# Patient Record
Sex: Female | Born: 1950 | Race: Black or African American | Hispanic: No | Smoking: Former smoker
Health system: Southern US, Community
[De-identification: ages and names within clinical notes are randomized; demographics above are authoritative.]

## PROBLEM LIST (undated history)

## (undated) DIAGNOSIS — H3320 Serous retinal detachment, unspecified eye: Secondary | ICD-10-CM

## (undated) DIAGNOSIS — E669 Obesity, unspecified: Secondary | ICD-10-CM

## (undated) DIAGNOSIS — I10 Essential (primary) hypertension: Secondary | ICD-10-CM

## (undated) HISTORY — PX: OTHER SURGICAL HISTORY: SHX169

## (undated) HISTORY — PX: EYE SURGERY: SHX253

## (undated) HISTORY — PX: CATARACT EXTRACTION: SUR2

## (undated) HISTORY — PX: JOINT REPLACEMENT: SHX530

---

## 2005-07-29 ENCOUNTER — Other Ambulatory Visit: Payer: Self-pay

## 2005-08-04 ENCOUNTER — Ambulatory Visit: Payer: Self-pay | Admitting: Unknown Physician Specialty

## 2006-10-11 ENCOUNTER — Ambulatory Visit: Payer: Self-pay | Admitting: General Practice

## 2006-10-24 ENCOUNTER — Ambulatory Visit: Payer: Self-pay

## 2007-07-03 ENCOUNTER — Ambulatory Visit: Payer: Self-pay | Admitting: Emergency Medicine

## 2007-09-04 ENCOUNTER — Ambulatory Visit: Payer: Self-pay | Admitting: Unknown Physician Specialty

## 2007-09-04 ENCOUNTER — Other Ambulatory Visit: Payer: Self-pay

## 2007-09-13 ENCOUNTER — Inpatient Hospital Stay: Payer: Self-pay | Admitting: Unknown Physician Specialty

## 2008-07-16 ENCOUNTER — Ambulatory Visit (HOSPITAL_COMMUNITY): Admission: RE | Admit: 2008-07-16 | Discharge: 2008-07-17 | Payer: Self-pay | Admitting: Ophthalmology

## 2011-03-30 NOTE — Op Note (Signed)
NAME:  Marissa Craig, Marissa Craig NO.:  [id number]   MEDICAL RECORD NO.:  [id number]          PATIENT TYPE:  OIB   LOCATION:  5121                         FACILITY:  [facility]   PHYSICIAN:  Beulah Gandy. Ashley Royalty, M.D. DATE OF BIRTH:  [birth year]   DATE OF PROCEDURE:  07/16/2008  DATE OF DISCHARGE:                               OPERATIVE REPORT   ADMISSION DIAGNOSES:  Macular hole and retinal break, right eye.   PROCEDURES:  Pars plana vitrectomy with membrane peel, retinal  photocoagulation, serum patch, and gas-fluid exchange in the right eye.   SURGEON:  Beulah Gandy. Ashley Royalty, MD   ASSISTANT:  Rosalie Doctor, MA   ANESTHESIA:  General.   DETAILS:  Usual prep and drape, conjunctival peritomies at 8, 10, and 2  o'clock.  Sclerotomies at 8, 10, and 2 o'clock.  Infusion at 8 o'clock.  The contact lens ring anchored into place at 6 and 12 o'clock.  Provisc  placed on the corneal surface and the flat contact lens was placed.  Pars plana vitrectomy was begun just behind the crystalline lens.  Vitreous and pigment were encountered.  This was carefully removed under  low suction and rapid cutting.  Attention was carried to the periphery  where the 30-degree prismatic lens was used to perform vitrectomy down  to the vitreous base for 360 degrees.  A large horseshoe tear and walled  off detachment were seen in the lower temporal quadrant.  This area was  cleansed of vitreous and vitreous traction.  A bridging vessel was seen  across the horseshoe tear flap.  The vitrectomy was continued until all  vitreous was removed out to the vitreous base.  The indirect  ophthalmoscope laser was moved into place, 280 burns were placed around  the retinal break and around the retinal periphery.  The power was 500  milliwatts, 1000 microns each, and 1.1 seconds each.  The magnifying  contact lens was then placed and the macular hole was studied.  The hole  was oval and taut because of an epiretinal membrane.   A 20-gauge MVR was  used to engage this membrane and peel it toward the macular hole and  then out from the macular hole out to the arcades and beyond the lighted  pick.  The Eagle pick and the MVR blade were used for this.  Once all of  the membrane was removed, the cutter was repositioned in the eye and all  remnants of the membrane were removed.  A total gas-fluid exchange was  carried.  The [place] Ophthalmic brush was used to make the retinal  surface dry.  The serum patch was prepared and perfluoropropane 16% was  prepared.  Additional [place] Ophthalmic brush vacuuming of blood was  performed.  The serum patch was delivered.  The C3F8 was exchanged for  intravitreal gas.  The instruments were removed from the eye, and 9-0  nylon was used to close the sclerotomy sites.  The conjunctiva was  closed with wet-field cautery.  Contact lens ring was removed.  Polymyxin and gentamicin were irrigated into [place] space.  Atropine  solution was applied.  Marcaine was injected around the  globe for postop pain.  Decadron 10 mg was injected to the lower  subconjunctival space.  Closing pressure was 15 with a  [place]  tonometer.  Complications none.  Duration 1 hour.  The patient was  awakened and taken to recovery in satisfactory condition.      Beulah Gandy. Ashley Royalty, M.D.  Electronically Signed     JDM/MEDQ  D:  07/16/2008  T:  07/17/2008  Job:  [id number]

## 2011-08-18 LAB — BASIC METABOLIC PANEL
BUN: 11
CO2: 27
Calcium: 9.5
Chloride: 106
Creatinine, Ser: 0.66
GFR calc Af Amer: >60
GFR calc non Af Amer: >60
Glucose, Bld: 106 — ABNORMAL HIGH
Potassium: 3.9
Sodium: 138

## 2011-08-18 LAB — AUTOLOGOUS SERUM PATCH PREP

## 2011-08-18 LAB — CBC
HCT: 39.2
Hemoglobin: 13.4
MCHC: 34.2
MCV: 95
Platelets: 240
RBC: 4.12
RDW: 13.1
WBC: 3.9 — ABNORMAL LOW

## 2013-06-05 ENCOUNTER — Ambulatory Visit: Payer: Self-pay | Admitting: Family Medicine

## 2014-01-28 ENCOUNTER — Ambulatory Visit: Payer: Self-pay | Admitting: Physician Assistant

## 2015-05-28 ENCOUNTER — Other Ambulatory Visit: Payer: Self-pay | Admitting: Physician Assistant

## 2015-05-28 DIAGNOSIS — Z1231 Encounter for screening mammogram for malignant neoplasm of breast: Secondary | ICD-10-CM

## 2015-06-04 ENCOUNTER — Ambulatory Visit
Admission: RE | Admit: 2015-06-04 | Discharge: 2015-06-04 | Disposition: A | Discharge status: Home / Self Care | Payer: BLUE CROSS/BLUE SHIELD | Source: Ambulatory Visit | Attending: Physician Assistant | Admitting: Physician Assistant

## 2015-06-04 DIAGNOSIS — Z1231 Encounter for screening mammogram for malignant neoplasm of breast: Secondary | ICD-10-CM | POA: Diagnosis not present

## 2015-12-19 ENCOUNTER — Encounter (INDEPENDENT_AMBULATORY_CARE_PROVIDER_SITE_OTHER): Payer: BLUE CROSS/BLUE SHIELD | Admitting: Ophthalmology

## 2015-12-19 DIAGNOSIS — I10 Essential (primary) hypertension: Secondary | ICD-10-CM

## 2015-12-19 DIAGNOSIS — H43813 Vitreous degeneration, bilateral: Secondary | ICD-10-CM

## 2015-12-19 DIAGNOSIS — H35341 Macular cyst, hole, or pseudohole, right eye: Secondary | ICD-10-CM

## 2015-12-19 DIAGNOSIS — H35371 Puckering of macula, right eye: Secondary | ICD-10-CM

## 2015-12-19 DIAGNOSIS — H2512 Age-related nuclear cataract, left eye: Secondary | ICD-10-CM

## 2015-12-19 DIAGNOSIS — H35033 Hypertensive retinopathy, bilateral: Secondary | ICD-10-CM | POA: Diagnosis not present

## 2016-03-16 ENCOUNTER — Encounter (INDEPENDENT_AMBULATORY_CARE_PROVIDER_SITE_OTHER): Payer: BLUE CROSS/BLUE SHIELD | Admitting: Ophthalmology

## 2016-03-16 DIAGNOSIS — H33301 Unspecified retinal break, right eye: Secondary | ICD-10-CM

## 2016-03-16 DIAGNOSIS — H35033 Hypertensive retinopathy, bilateral: Secondary | ICD-10-CM

## 2016-03-16 DIAGNOSIS — H35371 Puckering of macula, right eye: Secondary | ICD-10-CM

## 2016-03-16 DIAGNOSIS — H43812 Vitreous degeneration, left eye: Secondary | ICD-10-CM | POA: Diagnosis not present

## 2016-03-16 DIAGNOSIS — I10 Essential (primary) hypertension: Secondary | ICD-10-CM | POA: Diagnosis not present

## 2016-06-02 ENCOUNTER — Other Ambulatory Visit: Payer: Self-pay | Admitting: Physician Assistant

## 2016-06-02 DIAGNOSIS — Z1231 Encounter for screening mammogram for malignant neoplasm of breast: Secondary | ICD-10-CM

## 2016-06-16 ENCOUNTER — Ambulatory Visit
Admission: RE | Admit: 2016-06-16 | Discharge: 2016-06-16 | Disposition: A | Discharge status: Home / Self Care | Payer: BLUE CROSS/BLUE SHIELD | Source: Ambulatory Visit | Attending: Physician Assistant | Admitting: Physician Assistant

## 2016-06-16 ENCOUNTER — Other Ambulatory Visit: Payer: Self-pay | Admitting: Physician Assistant

## 2016-06-16 DIAGNOSIS — Z1231 Encounter for screening mammogram for malignant neoplasm of breast: Secondary | ICD-10-CM | POA: Diagnosis present

## 2016-09-16 ENCOUNTER — Ambulatory Visit (INDEPENDENT_AMBULATORY_CARE_PROVIDER_SITE_OTHER): Payer: BLUE CROSS/BLUE SHIELD | Admitting: Ophthalmology

## 2016-09-16 DIAGNOSIS — I10 Essential (primary) hypertension: Secondary | ICD-10-CM | POA: Diagnosis not present

## 2016-09-16 DIAGNOSIS — H35341 Macular cyst, hole, or pseudohole, right eye: Secondary | ICD-10-CM | POA: Diagnosis not present

## 2016-09-16 DIAGNOSIS — H43812 Vitreous degeneration, left eye: Secondary | ICD-10-CM | POA: Diagnosis not present

## 2016-09-16 DIAGNOSIS — H35033 Hypertensive retinopathy, bilateral: Secondary | ICD-10-CM

## 2016-09-16 DIAGNOSIS — H35371 Puckering of macula, right eye: Secondary | ICD-10-CM

## 2016-09-16 DIAGNOSIS — H2512 Age-related nuclear cataract, left eye: Secondary | ICD-10-CM

## 2016-10-01 ENCOUNTER — Encounter: Payer: Self-pay | Admitting: *Deleted

## 2016-10-04 ENCOUNTER — Encounter
Admission: RE | Disposition: A | Discharge status: Home / Self Care | Payer: Self-pay | Source: Ambulatory Visit | Attending: Unknown Physician Specialty

## 2016-10-04 ENCOUNTER — Encounter: Payer: Self-pay | Admitting: *Deleted

## 2016-10-04 ENCOUNTER — Ambulatory Visit
Admission: RE | Admit: 2016-10-04 | Discharge: 2016-10-04 | Disposition: A | Discharge status: Home / Self Care | Payer: BLUE CROSS/BLUE SHIELD | Source: Ambulatory Visit | Attending: Unknown Physician Specialty | Admitting: Unknown Physician Specialty

## 2016-10-04 ENCOUNTER — Ambulatory Visit: Payer: BLUE CROSS/BLUE SHIELD | Admitting: Anesthesiology

## 2016-10-04 DIAGNOSIS — Z1211 Encounter for screening for malignant neoplasm of colon: Secondary | ICD-10-CM | POA: Insufficient documentation

## 2016-10-04 DIAGNOSIS — Z86010 Personal history of colonic polyps: Secondary | ICD-10-CM | POA: Insufficient documentation

## 2016-10-04 DIAGNOSIS — Z87891 Personal history of nicotine dependence: Secondary | ICD-10-CM | POA: Insufficient documentation

## 2016-10-04 DIAGNOSIS — D123 Benign neoplasm of transverse colon: Secondary | ICD-10-CM | POA: Diagnosis not present

## 2016-10-04 DIAGNOSIS — K648 Other hemorrhoids: Secondary | ICD-10-CM | POA: Insufficient documentation

## 2016-10-04 DIAGNOSIS — K5730 Diverticulosis of large intestine without perforation or abscess without bleeding: Secondary | ICD-10-CM | POA: Diagnosis not present

## 2016-10-04 DIAGNOSIS — Z7982 Long term (current) use of aspirin: Secondary | ICD-10-CM | POA: Diagnosis not present

## 2016-10-04 DIAGNOSIS — I10 Essential (primary) hypertension: Secondary | ICD-10-CM | POA: Diagnosis not present

## 2016-10-04 DIAGNOSIS — Z6841 Body Mass Index (BMI) 40.0 and over, adult: Secondary | ICD-10-CM | POA: Insufficient documentation

## 2016-10-04 HISTORY — DX: Obesity, unspecified: E66.9

## 2016-10-04 HISTORY — DX: Serous retinal detachment, unspecified eye: H33.20

## 2016-10-04 HISTORY — PX: COLONOSCOPY WITH PROPOFOL: SHX5780

## 2016-10-04 HISTORY — DX: Essential (primary) hypertension: I10

## 2016-10-04 SURGERY — COLONOSCOPY WITH PROPOFOL
Anesthesia: General

## 2016-10-04 MED ORDER — FENTANYL CITRATE (PF) 100 MCG/2ML IJ SOLN
INTRAMUSCULAR | Status: DC | PRN
  Administered 2016-10-04: 50 ug via INTRAVENOUS

## 2016-10-04 MED ORDER — PROPOFOL 10 MG/ML IV BOLUS
INTRAVENOUS | Status: DC | PRN
  Administered 2016-10-04: 50 mg via INTRAVENOUS

## 2016-10-04 MED ORDER — PIPERACILLIN-TAZOBACTAM 3.375 G IVPB 30 MIN
3.3750 g | Freq: Once | INTRAVENOUS | Status: AC
  Administered 2016-10-04: 3.375 g via INTRAVENOUS
  Filled 2016-10-04: qty 50

## 2016-10-04 MED ORDER — SODIUM CHLORIDE 0.9 % IV SOLN: INTRAVENOUS | Status: DC

## 2016-10-04 MED ORDER — PHENYLEPHRINE HCL 10 MG/ML IJ SOLN
INTRAMUSCULAR | Status: DC | PRN
  Administered 2016-10-04 (×2): 100 ug via INTRAVENOUS

## 2016-10-04 MED ORDER — SODIUM CHLORIDE 0.9 % IV SOLN
INTRAVENOUS | Status: DC
  Administered 2016-10-04: 01:00:00 via INTRAVENOUS
  Administered 2016-10-04: 1000 mL via INTRAVENOUS

## 2016-10-04 MED ORDER — PROPOFOL 500 MG/50ML IV EMUL
INTRAVENOUS | Status: DC | PRN
Start: 2016-10-04 — End: 2016-10-04
  Administered 2016-10-04: 100 ug/kg/min via INTRAVENOUS

## 2016-10-04 NOTE — Op Note (Signed)
[facility] Gastroenterology Patient Name: Marissa Craig Procedure Date: 10/04/2016 10:13 AM MRN: [id number] Account #: [id number] Date of Birth: 1951/07/27 Admit Type: Outpatient Age: 65 Room: [facility] ENDO ROOM 1 Gender: Female Note Status: Finalized Procedure:            Colonoscopy Indications:          High risk colon cancer surveillance: Personal history                        of colonic polyps Providers:            Manya Silvas, MD Referring MD:         Precious Bard, MD (Referring MD) Medicines:            Propofol per Anesthesia Complications:        No immediate complications. Procedure:            Pre-Anesthesia Assessment:                       - After reviewing the risks and benefits, the patient                        was deemed in satisfactory condition to undergo the                        procedure.                       After obtaining informed consent, the colonoscope was                        passed under direct vision. Throughout the procedure,                        the patient's blood pressure, pulse, and oxygen                        saturations were monitored continuously. The                        Colonoscope was introduced through the anus and                        advanced to the the cecum, identified by appendiceal                        orifice and ileocecal valve. The colonoscopy was                        performed without difficulty. The patient tolerated the                        procedure well. The quality of the bowel preparation                        was excellent. Findings:      A small polyp was found in the transverse colon. The polyp was sessile.       The polyp was removed with a cold snare. Resection and retrieval were       complete.      A small polyp was found  in the transverse colon. The polyp was sessile.       The polyp was removed with a hot snare. Resection and retrieval were       complete. To  prevent bleeding after the polypectomy, one hemostatic clip       was successfully placed. There was no bleeding during, or at the end, of       the procedure.      Multiple small-mouthed diverticula were found in the sigmoid colon and       descending colon.      Internal hemorrhoids were found during endoscopy. The hemorrhoids were       small and Grade I (internal hemorrhoids that do not prolapse).      The exam was otherwise without abnormality. Impression:           - One small polyp in the transverse colon, removed with                        a cold snare. Resected and retrieved.                       - One small polyp in the transverse colon, removed with                        a hot snare. Resected and retrieved. Clip was placed.                       - Diverticulosis in the sigmoid colon and in the                        descending colon.                       - Internal hemorrhoids.                       - The examination was otherwise normal. Recommendation:       - Await pathology results. Manya Silvas, MD 10/04/2016 11:01:17 AM This report has been signed electronically. Number of Addenda: 0 Note Initiated On: 10/04/2016 10:13 AM Scope Withdrawal Time: 0 hours 11 minutes 13 seconds  Total Procedure Duration: 0 hours 20 minutes 41 seconds       [facility]

## 2016-10-04 NOTE — H&P (Signed)
   Primary Care Physician:  Marinda Elk, MD Primary Gastroenterologist:  Dr. Vira Agar  Pre-Procedure History & Physical: HPI:  Marissa Craig is a 65 y.o. female is here for an colonoscopy.   Past Medical History:  Diagnosis Date  . Hypertension   . Obesity   . Retinal detachment     Past Surgical History:  Procedure Laterality Date  . CATARACT EXTRACTION    . EYE SURGERY    . JOINT REPLACEMENT    . rtk      Prior to Admission medications   Medication Sig Start Date End Date Taking? Authorizing Provider  acetaminophen (TYLENOL) 500 MG tablet Take 500 mg by mouth every 6 (six) hours as needed.   Yes Historical Provider, MD  amLODipine (NORVASC) 5 MG tablet Take 5 mg by mouth daily.   Yes Historical Provider, MD  valsartan-hydrochlorothiazide (DIOVAN-HCT) 160-25 MG tablet Take 1 tablet by mouth daily.   Yes Historical Provider, MD  Zoster Vaccine Live (ZOSTAVAX [place]) Inject into the skin.   Yes Historical Provider, MD  aspirin EC 81 MG tablet Take 81 mg by mouth daily.    Historical Provider, MD    Allergies as of 07/02/2016  . (No Known Allergies)    History reviewed. No pertinent family history.  Social History   Social History  . Marital status: Divorced    Spouse name: N/A  . Number of children: N/A  . Years of education: N/A   Occupational History  . Not on file.   Social History Main Topics  . Smoking status: Former Research scientist (life sciences)  . Smokeless tobacco: Never Used  . Alcohol use No  . Drug use: No  . Sexual activity: Not on file   Other Topics Concern  . Not on file   Social History Narrative  . No narrative on file    Review of Systems: See HPI, otherwise negative ROS  Physical Exam: BP 139/82   Pulse 78   Temp 97.1 F (36.2 C) (Tympanic)   Resp 18   Ht 5\' 5"  (1.651 m)   Wt 127.9 kg (282 lb)   SpO2 99%   BMI 46.93 kg/m  General:   Alert,  pleasant and cooperative in NAD Head:  Normocephalic and atraumatic. Neck:  Supple; no masses or  thyromegaly. Lungs:  Clear throughout to auscultation.    Heart:  Regular rate and rhythm. Abdomen:  Soft, nontender and nondistended. Normal bowel sounds, without guarding, and without rebound.   Neurologic:  Alert and  oriented x4;  grossly normal neurologically.  Impression/Plan: Marissa Craig is here for an colonoscopy to be performed for [facility] colon polyps  Risks, benefits, limitations, and alternatives regarding  colonoscopy have been reviewed with the patient.  Questions have been answered.  All parties agreeable.   Gaylyn Cheers, MD  10/04/2016, 10:21 AM

## 2016-10-04 NOTE — Anesthesia Preprocedure Evaluation (Signed)
Anesthesia Evaluation  Patient identified by MRN, date of birth, ID band Patient awake    Reviewed: Allergy & Precautions, H&P , NPO status , Patient's Chart, lab work & pertinent test results, reviewed documented beta blocker date and time   Airway Mallampati: II   Neck ROM: full    Dental  (+) Poor Dentition   Pulmonary neg pulmonary ROS, former smoker,    Pulmonary exam normal        Cardiovascular hypertension, negative cardio ROS Normal cardiovascular exam     Neuro/Psych negative neurological ROS  negative psych ROS   GI/Hepatic negative GI ROS, Neg liver ROS,   Endo/Other  negative endocrine ROSMorbid obesity  Renal/GU negative Renal ROS  negative genitourinary   Musculoskeletal   Abdominal   Peds  Hematology negative hematology ROS (+)   Anesthesia Other Findings Past Medical History: No date: Hypertension No date: Obesity No date: Retinal detachment Past Surgical History: No date: CATARACT EXTRACTION No date: EYE SURGERY No date: JOINT REPLACEMENT No date: rtk   Reproductive/Obstetrics                             Anesthesia Physical Anesthesia Plan  ASA: III  Anesthesia Plan: General   Post-op Pain Management:    Induction:   Airway Management Planned:   Additional Equipment:   Intra-op Plan:   Post-operative Plan:   Informed Consent: I have reviewed the patients History and Physical, chart, labs and discussed the procedure including the risks, benefits and alternatives for the proposed anesthesia with the patient or authorized representative who has indicated his/her understanding and acceptance.   Dental Advisory Given  Plan Discussed with: CRNA  Anesthesia Plan Comments:         Anesthesia Quick Evaluation

## 2016-10-04 NOTE — Anesthesia Postprocedure Evaluation (Signed)
Anesthesia Post Note  Patient: Marissa Craig  Procedure(s) Performed: Procedure(s) (LRB): COLONOSCOPY WITH PROPOFOL (N/A)  Patient location during evaluation: PACU Anesthesia Type: General Level of consciousness: awake and alert Pain management: pain level controlled Vital Signs Assessment: post-procedure vital signs reviewed and stable Respiratory status: spontaneous breathing, nonlabored ventilation, respiratory function stable and patient connected to nasal cannula oxygen Cardiovascular status: blood pressure returned to baseline and stable Postop Assessment: no signs of nausea or vomiting Anesthetic complications: no    Last Vitals:  Vitals:   10/04/16 1110 10/04/16 1120  BP: 108/72 119/80  Pulse:    Resp:    Temp:      Last Pain:  Vitals:   10/04/16 1100  TempSrc: Tympanic                 Molli Barrows

## 2016-10-04 NOTE — Transfer of Care (Signed)
Immediate Anesthesia Transfer of Care Note  Patient: Marissa Craig  Procedure(s) Performed: Procedure(s) with comments: COLONOSCOPY WITH PROPOFOL (N/A) - Zosyn  Patient Location: PACU  Anesthesia Type:General  Level of Consciousness: awake, alert , oriented and patient cooperative  Airway & Oxygen Therapy: Patient Spontanous Breathing and Patient connected to nasal cannula oxygen  Post-op Assessment: Report given to RN, Post -op Vital signs reviewed and stable and Patient moving all extremities  Post vital signs: Reviewed and stable  Last Vitals:  Vitals:   10/04/16 1001  BP: 139/82  Pulse: 78  Resp: 18  Temp: 36.2 C    Last Pain:  Vitals:   10/04/16 1001  TempSrc: Tympanic         Complications: No apparent anesthesia complications

## 2016-10-05 LAB — SURGICAL PATHOLOGY

## 2016-10-06 ENCOUNTER — Encounter: Payer: Self-pay | Admitting: Unknown Physician Specialty

## 2017-06-07 ENCOUNTER — Other Ambulatory Visit: Payer: Self-pay | Admitting: Physician Assistant

## 2017-06-07 DIAGNOSIS — Z1231 Encounter for screening mammogram for malignant neoplasm of breast: Secondary | ICD-10-CM

## 2017-06-22 ENCOUNTER — Ambulatory Visit
Admission: RE | Admit: 2017-06-22 | Discharge: 2017-06-22 | Disposition: A | Discharge status: Home / Self Care | Payer: BLUE CROSS/BLUE SHIELD | Source: Ambulatory Visit | Attending: Physician Assistant | Admitting: Physician Assistant

## 2017-06-22 DIAGNOSIS — Z1231 Encounter for screening mammogram for malignant neoplasm of breast: Secondary | ICD-10-CM | POA: Diagnosis not present

## 2017-09-19 ENCOUNTER — Ambulatory Visit (INDEPENDENT_AMBULATORY_CARE_PROVIDER_SITE_OTHER): Payer: BLUE CROSS/BLUE SHIELD | Admitting: Ophthalmology

## 2017-09-19 DIAGNOSIS — H35033 Hypertensive retinopathy, bilateral: Secondary | ICD-10-CM | POA: Diagnosis not present

## 2017-09-19 DIAGNOSIS — H35371 Puckering of macula, right eye: Secondary | ICD-10-CM

## 2017-09-19 DIAGNOSIS — H59031 Cystoid macular edema following cataract surgery, right eye: Secondary | ICD-10-CM | POA: Diagnosis not present

## 2017-09-19 DIAGNOSIS — H43812 Vitreous degeneration, left eye: Secondary | ICD-10-CM | POA: Diagnosis not present

## 2017-09-19 DIAGNOSIS — H33301 Unspecified retinal break, right eye: Secondary | ICD-10-CM | POA: Diagnosis not present

## 2017-09-19 DIAGNOSIS — I10 Essential (primary) hypertension: Secondary | ICD-10-CM | POA: Diagnosis not present

## 2017-10-31 ENCOUNTER — Encounter (INDEPENDENT_AMBULATORY_CARE_PROVIDER_SITE_OTHER): Payer: BLUE CROSS/BLUE SHIELD | Admitting: Ophthalmology

## 2017-10-31 DIAGNOSIS — H43812 Vitreous degeneration, left eye: Secondary | ICD-10-CM | POA: Diagnosis not present

## 2017-10-31 DIAGNOSIS — H59031 Cystoid macular edema following cataract surgery, right eye: Secondary | ICD-10-CM

## 2017-10-31 DIAGNOSIS — I10 Essential (primary) hypertension: Secondary | ICD-10-CM

## 2017-10-31 DIAGNOSIS — H33301 Unspecified retinal break, right eye: Secondary | ICD-10-CM

## 2017-10-31 DIAGNOSIS — H35033 Hypertensive retinopathy, bilateral: Secondary | ICD-10-CM | POA: Diagnosis not present

## 2017-12-15 ENCOUNTER — Encounter (INDEPENDENT_AMBULATORY_CARE_PROVIDER_SITE_OTHER): Payer: BLUE CROSS/BLUE SHIELD | Admitting: Ophthalmology

## 2017-12-15 DIAGNOSIS — H35033 Hypertensive retinopathy, bilateral: Secondary | ICD-10-CM

## 2017-12-15 DIAGNOSIS — H43813 Vitreous degeneration, bilateral: Secondary | ICD-10-CM | POA: Diagnosis not present

## 2017-12-15 DIAGNOSIS — H59031 Cystoid macular edema following cataract surgery, right eye: Secondary | ICD-10-CM | POA: Diagnosis not present

## 2017-12-15 DIAGNOSIS — H35373 Puckering of macula, bilateral: Secondary | ICD-10-CM | POA: Diagnosis not present

## 2017-12-15 DIAGNOSIS — I10 Essential (primary) hypertension: Secondary | ICD-10-CM | POA: Diagnosis not present

## 2018-01-26 ENCOUNTER — Encounter (INDEPENDENT_AMBULATORY_CARE_PROVIDER_SITE_OTHER): Payer: BLUE CROSS/BLUE SHIELD | Admitting: Ophthalmology

## 2018-01-26 DIAGNOSIS — I10 Essential (primary) hypertension: Secondary | ICD-10-CM

## 2018-01-26 DIAGNOSIS — H2512 Age-related nuclear cataract, left eye: Secondary | ICD-10-CM | POA: Diagnosis not present

## 2018-01-26 DIAGNOSIS — H35341 Macular cyst, hole, or pseudohole, right eye: Secondary | ICD-10-CM

## 2018-01-26 DIAGNOSIS — H35033 Hypertensive retinopathy, bilateral: Secondary | ICD-10-CM | POA: Diagnosis not present

## 2018-01-26 DIAGNOSIS — H43813 Vitreous degeneration, bilateral: Secondary | ICD-10-CM | POA: Diagnosis not present

## 2018-01-26 DIAGNOSIS — H35371 Puckering of macula, right eye: Secondary | ICD-10-CM

## 2018-01-26 DIAGNOSIS — H59031 Cystoid macular edema following cataract surgery, right eye: Secondary | ICD-10-CM | POA: Diagnosis not present

## 2018-04-06 ENCOUNTER — Encounter (INDEPENDENT_AMBULATORY_CARE_PROVIDER_SITE_OTHER): Payer: BLUE CROSS/BLUE SHIELD | Admitting: Ophthalmology

## 2018-04-06 DIAGNOSIS — I10 Essential (primary) hypertension: Secondary | ICD-10-CM | POA: Diagnosis not present

## 2018-04-06 DIAGNOSIS — H2512 Age-related nuclear cataract, left eye: Secondary | ICD-10-CM | POA: Diagnosis not present

## 2018-04-06 DIAGNOSIS — H59031 Cystoid macular edema following cataract surgery, right eye: Secondary | ICD-10-CM | POA: Diagnosis not present

## 2018-04-06 DIAGNOSIS — H35373 Puckering of macula, bilateral: Secondary | ICD-10-CM

## 2018-04-06 DIAGNOSIS — H43812 Vitreous degeneration, left eye: Secondary | ICD-10-CM

## 2018-04-06 DIAGNOSIS — H35341 Macular cyst, hole, or pseudohole, right eye: Secondary | ICD-10-CM | POA: Diagnosis not present

## 2018-04-06 DIAGNOSIS — H35033 Hypertensive retinopathy, bilateral: Secondary | ICD-10-CM

## 2018-06-19 ENCOUNTER — Other Ambulatory Visit: Payer: Self-pay | Admitting: Physician Assistant

## 2018-06-19 DIAGNOSIS — Z1231 Encounter for screening mammogram for malignant neoplasm of breast: Secondary | ICD-10-CM

## 2018-07-07 ENCOUNTER — Encounter (INDEPENDENT_AMBULATORY_CARE_PROVIDER_SITE_OTHER): Payer: Medicare HMO | Admitting: Ophthalmology

## 2018-07-07 DIAGNOSIS — H35033 Hypertensive retinopathy, bilateral: Secondary | ICD-10-CM | POA: Diagnosis not present

## 2018-07-07 DIAGNOSIS — H2512 Age-related nuclear cataract, left eye: Secondary | ICD-10-CM | POA: Diagnosis not present

## 2018-07-07 DIAGNOSIS — H35341 Macular cyst, hole, or pseudohole, right eye: Secondary | ICD-10-CM | POA: Diagnosis not present

## 2018-07-07 DIAGNOSIS — H59031 Cystoid macular edema following cataract surgery, right eye: Secondary | ICD-10-CM | POA: Diagnosis not present

## 2018-07-07 DIAGNOSIS — H43813 Vitreous degeneration, bilateral: Secondary | ICD-10-CM | POA: Diagnosis not present

## 2018-07-07 DIAGNOSIS — I10 Essential (primary) hypertension: Secondary | ICD-10-CM | POA: Diagnosis not present

## 2018-07-07 DIAGNOSIS — H33301 Unspecified retinal break, right eye: Secondary | ICD-10-CM

## 2018-09-13 ENCOUNTER — Encounter (INDEPENDENT_AMBULATORY_CARE_PROVIDER_SITE_OTHER): Payer: Self-pay

## 2018-09-13 ENCOUNTER — Ambulatory Visit
Admission: RE | Admit: 2018-09-13 | Discharge: 2018-09-13 | Disposition: A | Discharge status: Home / Self Care | Payer: Medicare HMO | Source: Ambulatory Visit | Attending: Physician Assistant | Admitting: Physician Assistant

## 2018-09-13 DIAGNOSIS — Z1231 Encounter for screening mammogram for malignant neoplasm of breast: Secondary | ICD-10-CM | POA: Diagnosis not present

## 2018-09-14 ENCOUNTER — Other Ambulatory Visit: Payer: Self-pay | Admitting: Physician Assistant

## 2018-09-14 DIAGNOSIS — N6310 Unspecified lump in the right breast, unspecified quadrant: Secondary | ICD-10-CM

## 2018-09-14 DIAGNOSIS — R928 Other abnormal and inconclusive findings on diagnostic imaging of breast: Secondary | ICD-10-CM

## 2018-10-05 ENCOUNTER — Encounter (INDEPENDENT_AMBULATORY_CARE_PROVIDER_SITE_OTHER): Payer: Medicare HMO | Admitting: Ophthalmology

## 2018-10-05 ENCOUNTER — Ambulatory Visit
Admission: RE | Admit: 2018-10-05 | Discharge: 2018-10-05 | Disposition: A | Discharge status: Home / Self Care | Payer: Medicare HMO | Source: Ambulatory Visit | Attending: Physician Assistant | Admitting: Physician Assistant

## 2018-10-05 DIAGNOSIS — N6310 Unspecified lump in the right breast, unspecified quadrant: Secondary | ICD-10-CM | POA: Diagnosis present

## 2018-10-05 DIAGNOSIS — R928 Other abnormal and inconclusive findings on diagnostic imaging of breast: Secondary | ICD-10-CM | POA: Diagnosis present

## 2018-12-18 ENCOUNTER — Encounter (INDEPENDENT_AMBULATORY_CARE_PROVIDER_SITE_OTHER): Payer: Medicare HMO | Admitting: Ophthalmology

## 2018-12-21 ENCOUNTER — Encounter (INDEPENDENT_AMBULATORY_CARE_PROVIDER_SITE_OTHER): Payer: Medicare HMO | Admitting: Ophthalmology

## 2018-12-21 DIAGNOSIS — I10 Essential (primary) hypertension: Secondary | ICD-10-CM

## 2018-12-21 DIAGNOSIS — H59031 Cystoid macular edema following cataract surgery, right eye: Secondary | ICD-10-CM

## 2018-12-21 DIAGNOSIS — H43812 Vitreous degeneration, left eye: Secondary | ICD-10-CM

## 2018-12-21 DIAGNOSIS — H35371 Puckering of macula, right eye: Secondary | ICD-10-CM

## 2018-12-21 DIAGNOSIS — H35033 Hypertensive retinopathy, bilateral: Secondary | ICD-10-CM

## 2018-12-21 DIAGNOSIS — H3533 Angioid streaks of macula: Secondary | ICD-10-CM

## 2019-04-26 ENCOUNTER — Encounter (INDEPENDENT_AMBULATORY_CARE_PROVIDER_SITE_OTHER): Payer: Medicare HMO | Admitting: Ophthalmology

## 2019-05-14 ENCOUNTER — Encounter (INDEPENDENT_AMBULATORY_CARE_PROVIDER_SITE_OTHER): Payer: Medicare HMO | Admitting: Ophthalmology

## 2019-05-14 ENCOUNTER — Other Ambulatory Visit: Payer: Self-pay

## 2019-05-14 DIAGNOSIS — H35341 Macular cyst, hole, or pseudohole, right eye: Secondary | ICD-10-CM

## 2019-05-14 DIAGNOSIS — I10 Essential (primary) hypertension: Secondary | ICD-10-CM | POA: Diagnosis not present

## 2019-05-14 DIAGNOSIS — H59031 Cystoid macular edema following cataract surgery, right eye: Secondary | ICD-10-CM

## 2019-05-14 DIAGNOSIS — H33301 Unspecified retinal break, right eye: Secondary | ICD-10-CM

## 2019-05-14 DIAGNOSIS — H35033 Hypertensive retinopathy, bilateral: Secondary | ICD-10-CM | POA: Diagnosis not present

## 2019-05-14 DIAGNOSIS — H35371 Puckering of macula, right eye: Secondary | ICD-10-CM

## 2019-05-14 DIAGNOSIS — H43813 Vitreous degeneration, bilateral: Secondary | ICD-10-CM

## 2019-05-14 DIAGNOSIS — H2512 Age-related nuclear cataract, left eye: Secondary | ICD-10-CM

## 2019-08-06 ENCOUNTER — Other Ambulatory Visit: Payer: Self-pay | Admitting: Physician Assistant

## 2019-08-06 DIAGNOSIS — Z1231 Encounter for screening mammogram for malignant neoplasm of breast: Secondary | ICD-10-CM

## 2019-09-10 ENCOUNTER — Encounter (INDEPENDENT_AMBULATORY_CARE_PROVIDER_SITE_OTHER): Payer: Medicare HMO | Admitting: Ophthalmology

## 2019-09-10 ENCOUNTER — Other Ambulatory Visit: Payer: Self-pay

## 2019-09-10 DIAGNOSIS — I10 Essential (primary) hypertension: Secondary | ICD-10-CM

## 2019-09-10 DIAGNOSIS — H43813 Vitreous degeneration, bilateral: Secondary | ICD-10-CM

## 2019-09-10 DIAGNOSIS — H59031 Cystoid macular edema following cataract surgery, right eye: Secondary | ICD-10-CM

## 2019-09-10 DIAGNOSIS — H35371 Puckering of macula, right eye: Secondary | ICD-10-CM | POA: Diagnosis not present

## 2019-09-10 DIAGNOSIS — H33301 Unspecified retinal break, right eye: Secondary | ICD-10-CM

## 2019-09-10 DIAGNOSIS — H35033 Hypertensive retinopathy, bilateral: Secondary | ICD-10-CM | POA: Diagnosis not present

## 2019-09-10 DIAGNOSIS — H2512 Age-related nuclear cataract, left eye: Secondary | ICD-10-CM

## 2019-09-20 ENCOUNTER — Ambulatory Visit
Admission: RE | Admit: 2019-09-20 | Discharge: 2019-09-20 | Disposition: A | Discharge status: Home / Self Care | Payer: Medicare HMO | Source: Ambulatory Visit | Attending: Physician Assistant | Admitting: Physician Assistant

## 2019-09-20 ENCOUNTER — Other Ambulatory Visit: Payer: Self-pay

## 2019-09-20 DIAGNOSIS — Z1231 Encounter for screening mammogram for malignant neoplasm of breast: Secondary | ICD-10-CM | POA: Diagnosis not present

## 2019-12-08 ENCOUNTER — Other Ambulatory Visit: Payer: Self-pay

## 2019-12-08 DIAGNOSIS — Z20822 Contact with and (suspected) exposure to covid-19: Secondary | ICD-10-CM

## 2019-12-09 LAB — NOVEL CORONAVIRUS, NAA: SARS-CoV-2, NAA: NOT DETECTED

## 2019-12-10 ENCOUNTER — Other Ambulatory Visit: Payer: Medicare HMO

## 2019-12-12 IMAGING — US ULTRASOUND RIGHT BREAST LIMITED
1 series · 6 of 6 positions shown · non-contrast
Comparison: Previous exam(s).

CLINICAL DATA: Possible mass in the 12 o'clock position of the
right breast on a recent screening mammogram.

EXAM:
DIGITAL DIAGNOSTIC RIGHT MAMMOGRAM WITH TOMO
ULTRASOUND RIGHT BREAST

[Series 1: ultrasound right breast limited · 0.06mm/px · 6 of 6 slices shown]
[im 1/6]
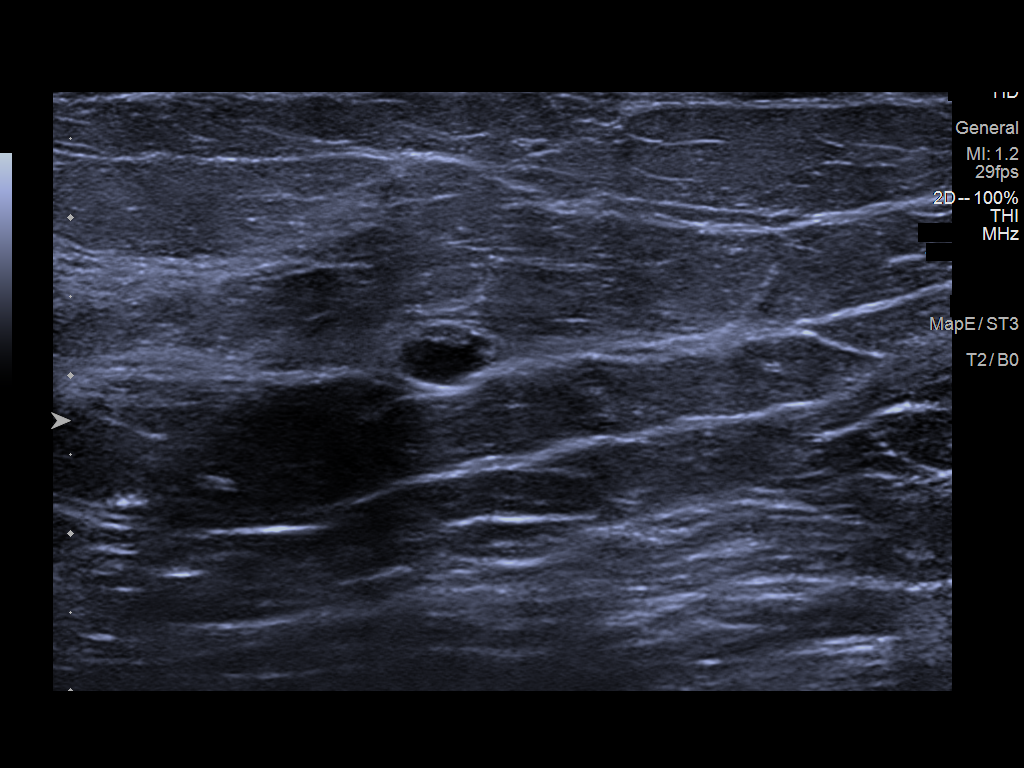
[im 2/6]
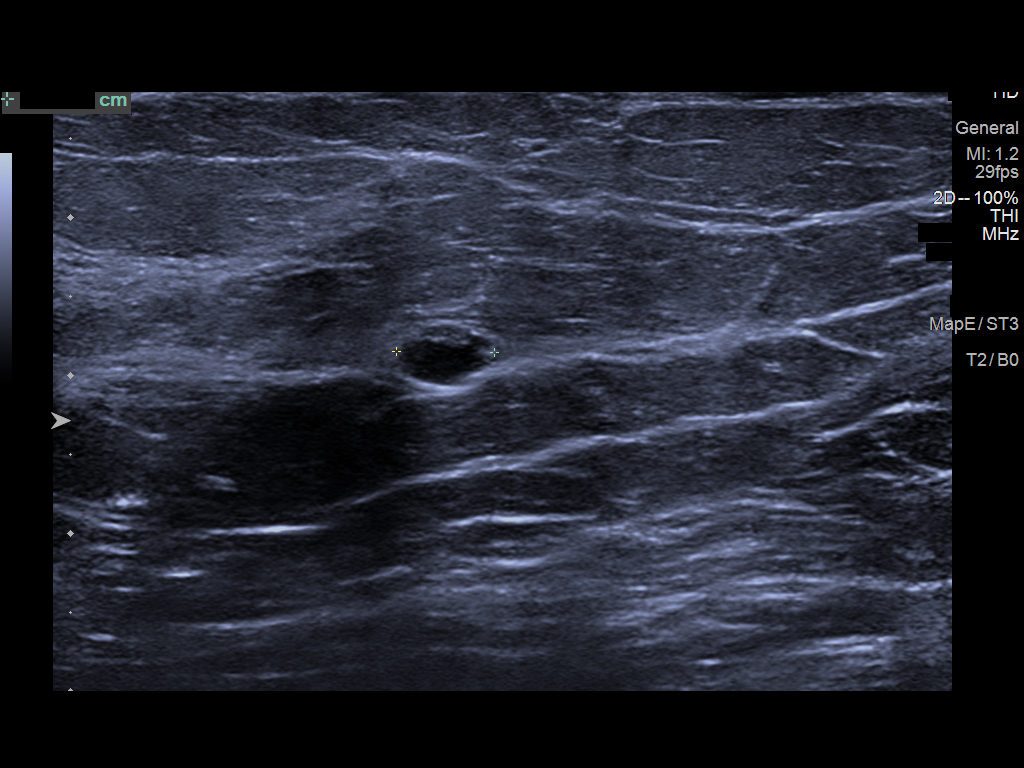
[im 3/6]
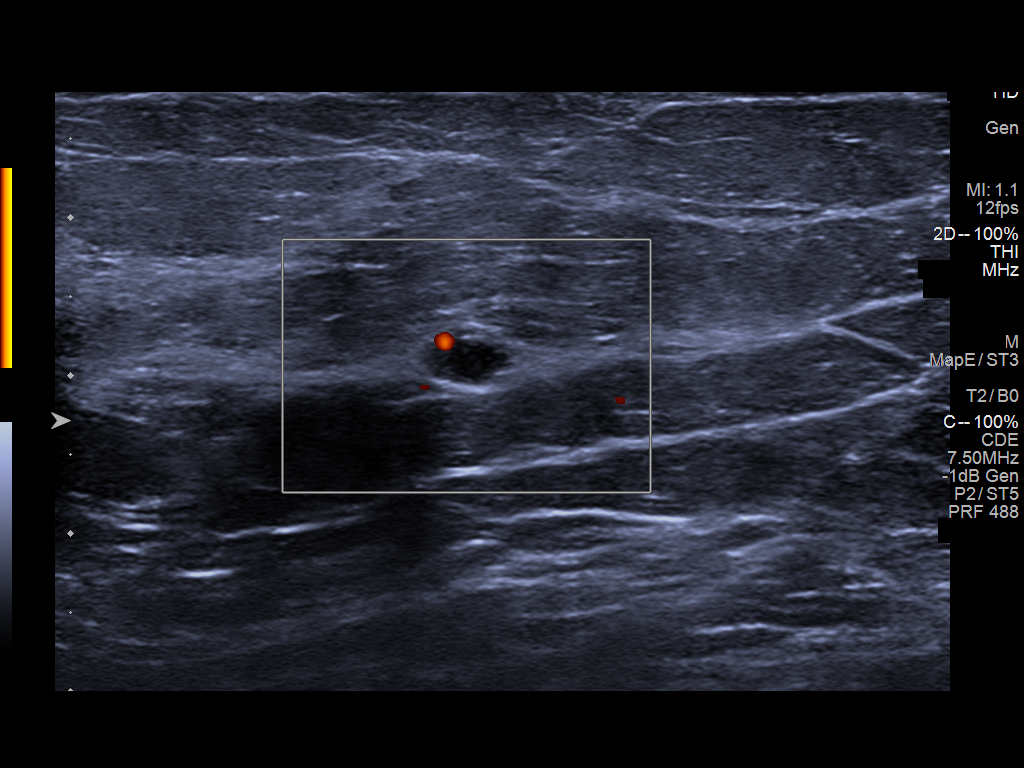
[im 4/6]
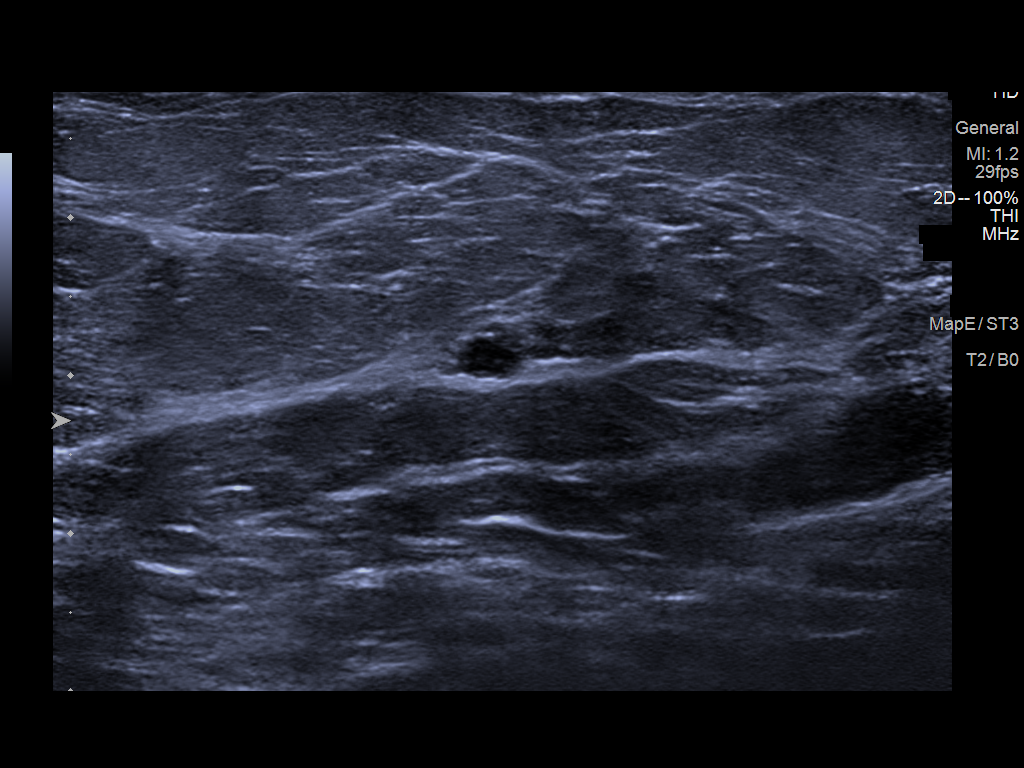
[im 5/6]
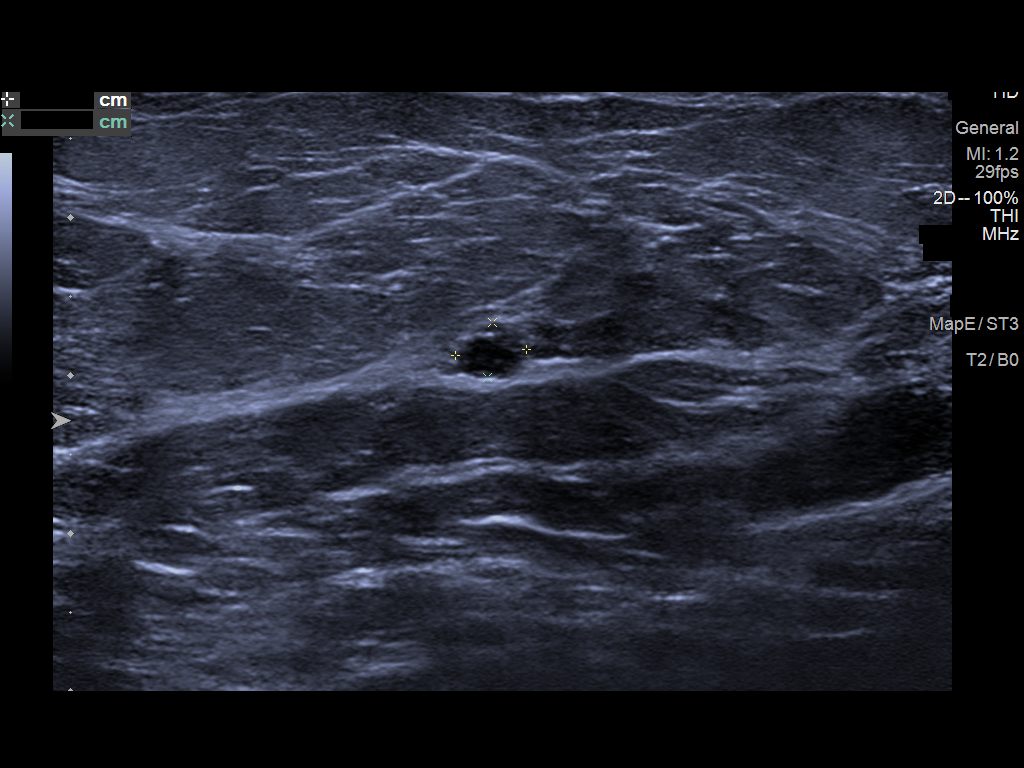
[im 6/6]
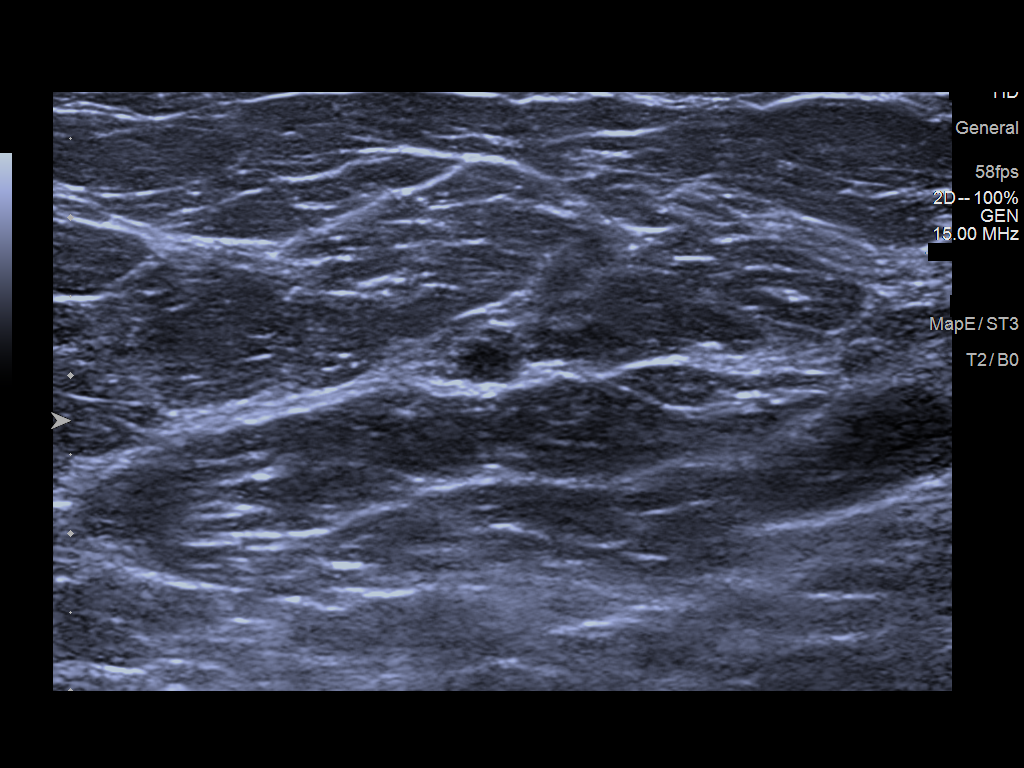

[6 of 6 positions shown; findings below may reference images not displayed]

ACR Breast Density Category b: There are scattered areas of
fibroglandular density.
FINDINGS: 3D tomographic and 2D generated spot compression views of the right
breast confirm a small, oval, circumscribed mass in the 12 o'clock
position of the right breast slightly anteriorly.

On physical exam, no mass is palpable in the 12 o'clock position of
the right breast.

Targeted ultrasound is performed, showing a 6 mm cyst containing
low-level internal echoes in the 12 o'clock position of the right
breast, 3 cm from the nipple. No internal blood flow was seen with
power Doppler.
IMPRESSION: Small benign right breast cyst.  No evidence of malignancy.

RECOMMENDATION:
Bilateral screening mammogram in 1 year.

I have discussed the findings and recommendations with the patient.
Results were also provided in writing at the conclusion of the
visit. If applicable, a reminder letter will be sent to the patient
regarding the next appointment.

BI-RADS CATEGORY  2: Benign.

## 2020-01-17 ENCOUNTER — Other Ambulatory Visit: Payer: Self-pay

## 2020-01-17 ENCOUNTER — Encounter (INDEPENDENT_AMBULATORY_CARE_PROVIDER_SITE_OTHER): Payer: Medicare HMO | Admitting: Ophthalmology

## 2020-01-17 DIAGNOSIS — H35341 Macular cyst, hole, or pseudohole, right eye: Secondary | ICD-10-CM

## 2020-01-17 DIAGNOSIS — H59031 Cystoid macular edema following cataract surgery, right eye: Secondary | ICD-10-CM

## 2020-01-17 DIAGNOSIS — H2512 Age-related nuclear cataract, left eye: Secondary | ICD-10-CM

## 2020-01-17 DIAGNOSIS — I10 Essential (primary) hypertension: Secondary | ICD-10-CM

## 2020-01-17 DIAGNOSIS — H33301 Unspecified retinal break, right eye: Secondary | ICD-10-CM

## 2020-01-17 DIAGNOSIS — H43813 Vitreous degeneration, bilateral: Secondary | ICD-10-CM

## 2020-01-17 DIAGNOSIS — H35033 Hypertensive retinopathy, bilateral: Secondary | ICD-10-CM

## 2020-05-22 ENCOUNTER — Encounter (INDEPENDENT_AMBULATORY_CARE_PROVIDER_SITE_OTHER): Payer: Medicare HMO | Admitting: Ophthalmology

## 2020-06-12 ENCOUNTER — Other Ambulatory Visit: Payer: Self-pay

## 2020-06-12 ENCOUNTER — Encounter (INDEPENDENT_AMBULATORY_CARE_PROVIDER_SITE_OTHER): Payer: Medicare HMO | Admitting: Ophthalmology

## 2020-06-12 DIAGNOSIS — H33301 Unspecified retinal break, right eye: Secondary | ICD-10-CM | POA: Diagnosis not present

## 2020-06-12 DIAGNOSIS — H59031 Cystoid macular edema following cataract surgery, right eye: Secondary | ICD-10-CM

## 2020-06-12 DIAGNOSIS — H35033 Hypertensive retinopathy, bilateral: Secondary | ICD-10-CM | POA: Diagnosis not present

## 2020-06-12 DIAGNOSIS — I10 Essential (primary) hypertension: Secondary | ICD-10-CM | POA: Diagnosis not present

## 2020-06-12 DIAGNOSIS — H43813 Vitreous degeneration, bilateral: Secondary | ICD-10-CM

## 2020-09-07 ENCOUNTER — Emergency Department
Admission: EM | Admit: 2020-09-07 | Discharge: 2020-09-07 | Disposition: A | Discharge status: Home / Self Care | Payer: Medicare HMO | Attending: Emergency Medicine | Admitting: Emergency Medicine

## 2020-09-07 ENCOUNTER — Other Ambulatory Visit: Payer: Self-pay

## 2020-09-07 DIAGNOSIS — Z87891 Personal history of nicotine dependence: Secondary | ICD-10-CM | POA: Diagnosis not present

## 2020-09-07 DIAGNOSIS — I10 Essential (primary) hypertension: Secondary | ICD-10-CM | POA: Diagnosis not present

## 2020-09-07 DIAGNOSIS — Z79899 Other long term (current) drug therapy: Secondary | ICD-10-CM | POA: Insufficient documentation

## 2020-09-07 DIAGNOSIS — Z96651 Presence of right artificial knee joint: Secondary | ICD-10-CM | POA: Diagnosis not present

## 2020-09-07 DIAGNOSIS — U071 COVID-19: Secondary | ICD-10-CM | POA: Diagnosis not present

## 2020-09-07 DIAGNOSIS — R059 Cough, unspecified: Secondary | ICD-10-CM | POA: Diagnosis present

## 2020-09-07 DIAGNOSIS — Z7982 Long term (current) use of aspirin: Secondary | ICD-10-CM | POA: Insufficient documentation

## 2020-09-07 DIAGNOSIS — R55 Syncope and collapse: Secondary | ICD-10-CM | POA: Insufficient documentation

## 2020-09-07 LAB — TROPONIN I (HIGH SENSITIVITY): Troponin I (High Sensitivity): 4 ng/L (ref <18)

## 2020-09-07 LAB — BASIC METABOLIC PANEL
Anion gap: 9 (ref 5–15)
BUN: 10 mg/dL (ref 8–23)
CO2: 27 mmol/L (ref 22–32)
Calcium: 9.1 mg/dL (ref 8.9–10.3)
Chloride: 101 mmol/L (ref 98–111)
Creatinine, Ser: 0.8 mg/dL (ref 0.44–1.00)
GFR, Estimated: >60 mL/min (ref >60)
Glucose, Bld: 123 mg/dL — ABNORMAL HIGH (ref 70–99)
Potassium: 3.2 mmol/L — ABNORMAL LOW (ref 3.5–5.1)
Sodium: 137 mmol/L (ref 135–145)

## 2020-09-07 LAB — URINALYSIS, COMPLETE (UACMP) WITH MICROSCOPIC
Bilirubin Urine: NEGATIVE
Glucose, UA: NEGATIVE mg/dL
Hgb urine dipstick: NEGATIVE
Ketones, ur: NEGATIVE mg/dL
Leukocytes,Ua: NEGATIVE
Nitrite: NEGATIVE
Protein, ur: NEGATIVE mg/dL
Specific Gravity, Urine: 1.013 (ref 1.005–1.030)
pH: 7 (ref 5.0–8.0)

## 2020-09-07 LAB — RESPIRATORY PANEL BY RT PCR (FLU A&B, COVID)
Influenza A by PCR: NEGATIVE
Influenza B by PCR: NEGATIVE
SARS Coronavirus 2 by RT PCR: POSITIVE — AB

## 2020-09-07 LAB — CBC
HCT: 37.8 % (ref 36.0–46.0)
Hemoglobin: 13 g/dL (ref 12.0–15.0)
MCH: 31.7 pg (ref 26.0–34.0)
MCHC: 34.4 g/dL (ref 30.0–36.0)
MCV: 92.2 fL (ref 80.0–100.0)
Platelets: 230 10*3/uL (ref 150–400)
RBC: 4.1 MIL/uL (ref 3.87–5.11)
RDW: 11.9 % (ref 11.5–15.5)
WBC: 3.3 10*3/uL — ABNORMAL LOW (ref 4.0–10.5)
nRBC: 0 % (ref 0.0–0.2)

## 2020-09-07 MED ORDER — ONDANSETRON HCL 4 MG/2ML IJ SOLN
4.0000 mg | Freq: Once | INTRAMUSCULAR | Status: AC
  Administered 2020-09-07: 4 mg via INTRAVENOUS
  Filled 2020-09-07: qty 2

## 2020-09-07 MED ORDER — SODIUM CHLORIDE 0.9 % IV BOLUS
1000.0000 mL | Freq: Once | INTRAVENOUS | Status: AC
  Administered 2020-09-07: 1000 mL via INTRAVENOUS

## 2020-09-07 NOTE — ED Provider Notes (Signed)
[facility] Emergency Department Provider Note  Time seen: 1:03 PM  I have reviewed the triage vital signs and the nursing notes.   HISTORY  Chief Complaint Loss of Consciousness   HPI Marissa Craig is a 69 y.o. female with a past medical history of hypertension presents to the emergency department for a syncopal episode.  According to the patient around 10 AM this morning she was making breakfast in her kitchen when she began feeling very lightheaded and had a brief syncopal event.  Patient states she is feeling better currently.  States over the past week or so she has been experiencing symptoms such as cough congestion low-grade fever and generalized weakness.  Patient states a family member that was recently visiting tested positive for Covid approximately 5 days after leaving her house, but did not appear to have symptoms during his stay with them.  Patient was vaccinated against Covid in March.  Past Medical History:  Diagnosis Date  . Hypertension   . Obesity   . Retinal detachment     There are no problems to display for this patient.   Past Surgical History:  Procedure Laterality Date  . CATARACT EXTRACTION    . COLONOSCOPY WITH PROPOFOL N/A 10/04/2016   Procedure: COLONOSCOPY WITH PROPOFOL;  Surgeon: Manya Silvas, MD;  Location: [facility] ENDOSCOPY;  Service: Endoscopy;  Laterality: N/A;  Zosyn  . EYE SURGERY    . JOINT REPLACEMENT    . rtk      Prior to Admission medications   Medication Sig Start Date End Date Taking? Authorizing Provider  acetaminophen (TYLENOL) 500 MG tablet Take 500 mg by mouth every 6 (six) hours as needed.    [provider]  amLODipine (NORVASC) 5 MG tablet Take 5 mg by mouth daily.    [provider]  aspirin EC 81 MG tablet Take 81 mg by mouth daily.    [provider]  valsartan-hydrochlorothiazide (DIOVAN-HCT) 160-25 MG tablet Take 1 tablet by mouth daily.    [provider]   Zoster Vaccine Live (ZOSTAVAX [place]) Inject into the skin.    [provider]    Allergies  Allergen Reactions  . Soap Other (See Comments)    Skin burning    Family History  Problem Relation Age of Onset  . Breast cancer Neg Hx     Social History Social History   Tobacco Use  . Smoking status: Former Research scientist (life sciences)  . Smokeless tobacco: Never Used  Substance Use Topics  . Alcohol use: No  . Drug use: No    Review of Systems Constitutional: Low-grade subjective fever.  Positive for generalized weakness. ENT: Mild congestion Cardiovascular: Negative for chest pain. Respiratory: Positive for cough.  Negative for shortness of breath. Gastrointestinal: Negative for abdominal pain, vomiting and diarrhea. Genitourinary: Negative for urinary compaints Musculoskeletal: Negative for musculoskeletal complaints Neurological: Negative for headache All other ROS negative  ____________________________________________   PHYSICAL EXAM:  VITAL SIGNS: ED Triage Vitals  Enc Vitals Group     BP 09/07/20 1128 107/67     Pulse Rate 09/07/20 1128 76     Resp 09/07/20 1128 18     Temp 09/07/20 1128 99 F (37.2 C)     Temp Source 09/07/20 1128 Oral     SpO2 09/07/20 1128 99 %     Weight 09/07/20 1127 260 lb (117.9 kg)     Height 09/07/20 1127 5\' 5"  (1.651 m)     Head Circumference --  Peak Flow --      Pain Score 09/07/20 1127 0     Pain Loc --      Pain Edu? --      Excl. in [place]? --     Constitutional: Alert and oriented. Well appearing and in no distress. Eyes: Normal exam ENT      Head: Normocephalic and atraumatic.      Mouth/Throat: Mucous membranes are moist. Cardiovascular: Normal rate, regular rhythm. Respiratory: Normal respiratory effort without tachypnea nor retractions. Breath sounds are clear Gastrointestinal: Soft and nontender. No distention. Musculoskeletal: Nontender with normal range of motion in all extremities.  Neurologic:  Normal speech and  language. No gross focal neurologic deficits Skin:  Skin is warm, dry and intact.  Psychiatric: Mood and affect are normal.   ____________________________________________    EKG  EKG viewed and interpreted by myself shows a normal sinus rhythm at 72 bpm with a narrow QRS, normal axis, normal intervals, no concerning ST changes.  ____________________________________________   INITIAL IMPRESSION / ASSESSMENT AND PLAN / ED COURSE  Pertinent labs & imaging results that were available during my care of the patient were reviewed by me and considered in my medical decision making (see chart for details).   Patient presents to the emergency department for generalized weakness and a brief syncopal episode.  Patient states for the past week or so she has not been feeling well.  Did have a recent possible Covid exposure to a family member.  Patient is vaccinated however received her second vaccination in March.  We will check labs, IV hydrate and closely monitor.  We will check a Covid swab as a precaution as well.  Patient agreeable to plan of care.  Marissa Craig was evaluated in Emergency Department on 09/07/2020 for the symptoms described in the history of present illness. She was evaluated in the context of the global COVID-19 pandemic, which necessitated consideration that the patient might be at risk for infection with the SARS-CoV-2 virus that causes COVID-19. Institutional protocols and algorithms that pertain to the evaluation of patients at risk for COVID-19 are in a state of rapid change based on information released by regulatory bodies including the CDC and federal and state organizations. These policies and algorithms were followed during the patient's care in the ED.  ____________________________________________   FINAL CLINICAL IMPRESSION(S) / ED DIAGNOSES  Syncope Weakness   Harvest Dark, MD 09/07/20 1306

## 2020-09-07 NOTE — Discharge Instructions (Signed)
Use Tylenol for pain and fevers.  Up to 1000 mg per dose, up to 4 times per day.  Do not take more than 4000 mg of Tylenol/acetaminophen within 24 hours.  Return to the ED with any worsening symptoms.

## 2020-09-07 NOTE — ED Notes (Signed)
Date and time results received: 09/07/20 1619 (use smartphrase ".now" to insert current time)  Test: COVID Critical Value: Positive  Name of Provider Notified: Tamala Julian, MD  Orders Received? Or Actions Taken?: Orders Received - See Orders for details

## 2020-09-07 NOTE — ED Triage Notes (Signed)
Pt in via EMS from home with c/o syncopal episode. Per family pt passed out. Pt alert upon EMS arrival. Pt had COVID 19 exposure a week ago. Pt with tiredness and decrease appetite since. Pt has been fully vaccinated. No pain, pt just feels tired. 136/69, HR 65, FSBS 140

## 2020-09-07 NOTE — ED Provider Notes (Signed)
Patient received in signout from Dr. Kerman Passey for evaluation of syncope in the setting of 1 week of generalized weakness.  Patient reports resolution of symptoms after IV fluids and her syncopal episode was likely vasovagal in nature.  Patient's Covid test returns positive in the setting of her being fully vaccinated.  I educate the patient of this test result and my recommendations for masking and quarantine of the home per [facility] guidelines.  Patient has been normoxic without evidence of respiratory distress and no indications for inpatient admission at this time.  We discussed return precautions for the ED.  Patient stable for discharge home.   Vladimir Crofts, MD 09/07/20 [phone]

## 2020-09-08 ENCOUNTER — Other Ambulatory Visit: Payer: Self-pay | Admitting: Nurse Practitioner

## 2020-09-08 ENCOUNTER — Ambulatory Visit (HOSPITAL_COMMUNITY)
Admission: RE | Admit: 2020-09-08 | Discharge: 2020-09-08 | Disposition: A | Discharge status: Home / Self Care | Payer: Medicare Other | Source: Ambulatory Visit | Attending: Pulmonary Disease | Admitting: Pulmonary Disease

## 2020-09-08 DIAGNOSIS — U071 COVID-19: Secondary | ICD-10-CM

## 2020-09-08 DIAGNOSIS — Z23 Encounter for immunization: Secondary | ICD-10-CM | POA: Diagnosis not present

## 2020-09-08 MED ORDER — SODIUM CHLORIDE 0.9 % IV SOLN: Freq: Once | INTRAVENOUS | Status: AC

## 2020-09-08 MED ORDER — ACETAMINOPHEN 325 MG PO TABS
650.0000 mg | ORAL_TABLET | Freq: Once | ORAL | Status: AC
  Administered 2020-09-08: 650 mg via ORAL
  Filled 2020-09-08: qty 2

## 2020-09-08 MED ORDER — EPINEPHRINE 0.3 MG/0.3ML IJ SOAJ: 0.3000 mg | Freq: Once | INTRAMUSCULAR | Status: DC | PRN

## 2020-09-08 MED ORDER — METHYLPREDNISOLONE SODIUM SUCC 125 MG IJ SOLR: 125.0000 mg | Freq: Once | INTRAMUSCULAR | Status: DC | PRN

## 2020-09-08 MED ORDER — SODIUM CHLORIDE 0.9 % IV SOLN: INTRAVENOUS | Status: DC | PRN

## 2020-09-08 MED ORDER — ALBUTEROL SULFATE HFA 108 (90 BASE) MCG/ACT IN AERS: 2.0000 | INHALATION_SPRAY | Freq: Once | RESPIRATORY_TRACT | Status: DC | PRN

## 2020-09-08 MED ORDER — FAMOTIDINE IN NACL 20-0.9 MG/50ML-% IV SOLN: 20.0000 mg | Freq: Once | INTRAVENOUS | Status: DC | PRN

## 2020-09-08 MED ORDER — DIPHENHYDRAMINE HCL 50 MG/ML IJ SOLN: 50.0000 mg | Freq: Once | INTRAMUSCULAR | Status: DC | PRN

## 2020-09-08 NOTE — Discharge Instructions (Signed)

## 2020-09-08 NOTE — Progress Notes (Signed)
I connected by phone with Marissa Craig on 09/08/2020 at 9:16 AM to discuss the potential use of an new treatment for mild to moderate COVID-19 viral infection in non-hospitalized patients.  This patient is a 69 y.o. female that meets the FDA criteria for Emergency Use Authorization of bamlanivimab/etesevimab or casirivimab\imdevimab.  Has a (+) direct SARS-CoV-2 viral test result  Has mild or moderate COVID-19   Is ? 69 years of age and weighs ? 40 kg  Is NOT hospitalized due to COVID-19  Is NOT requiring oxygen therapy or requiring an increase in baseline oxygen flow rate due to COVID-19  Is within 10 days of symptom onset  Has at least one of the high risk factor(s) for progression to severe COVID-19 and/or hospitalization as defined in EUA.  Specific high risk criteria : Older age (>/= 67 yo), BMI > 25 and Cardiovascular disease or hypertension   I have spoken and communicated the following to the patient or parent/caregiver:  1. FDA has authorized the emergency use of bamlanivimab/etesevimab and casirivimab\imdevimab for the treatment of mild to moderate COVID-19 in adults and pediatric patients with positive results of direct SARS-CoV-2 viral testing who are 55 years of age and older weighing at least 40 kg, and who are at high risk for progressing to severe COVID-19 and/or hospitalization.  2. The significant known and potential risks and benefits of bamlanivimab/etesevimab and casirivimab\imdevimab, and the extent to which such potential risks and benefits are unknown.  3. Information on available alternative treatments and the risks and benefits of those alternatives, including clinical trials.  4. Patients treated with bamlanivimab/etesevimab and casirivimab\imdevimab should continue to self-isolate and use infection control measures (e.g., wear mask, isolate, social distance, avoid sharing personal items, clean and disinfect "high touch" surfaces, and frequent handwashing)  according to CDC guidelines.   5. The patient or parent/caregiver has the option to accept or refuse bamlanivimab/etesevimab or casirivimab\imdevimab .  After reviewing this information with the patient, the patient has agreed to receive one of the available covid 19 monoclonal antibodies and will be provided an appropriate fact sheet prior to infusion.Beckey Rutter, [place], AGNP-C [phone] ([place])

## 2020-09-08 NOTE — Progress Notes (Signed)
  Diagnosis: COVID-19  Physician:  Asencion Noble  Procedure: [facility] Med: bamlanivimab\etesevimab infusion - Provided patient with bamlanimivab\etesevimab fact sheet for patients, parents and caregivers prior to infusion.  Complications: No immediate complications noted.  Discharge: Discharged home   Dorene Sorrow 09/08/2020

## 2020-09-26 ENCOUNTER — Other Ambulatory Visit: Payer: Self-pay | Admitting: Physician Assistant

## 2020-09-26 DIAGNOSIS — Z1231 Encounter for screening mammogram for malignant neoplasm of breast: Secondary | ICD-10-CM

## 2020-10-08 ENCOUNTER — Other Ambulatory Visit: Payer: Self-pay

## 2020-10-08 ENCOUNTER — Ambulatory Visit
Admission: RE | Admit: 2020-10-08 | Discharge: 2020-10-08 | Disposition: A | Discharge status: Home / Self Care | Payer: Medicare HMO | Source: Ambulatory Visit | Attending: Physician Assistant | Admitting: Physician Assistant

## 2020-10-08 DIAGNOSIS — Z1231 Encounter for screening mammogram for malignant neoplasm of breast: Secondary | ICD-10-CM | POA: Diagnosis present

## 2020-11-25 ENCOUNTER — Encounter (INDEPENDENT_AMBULATORY_CARE_PROVIDER_SITE_OTHER): Payer: Medicare HMO | Admitting: Ophthalmology

## 2020-11-26 IMAGING — MG DIGITAL SCREENING BILAT W/ TOMO W/ CAD
8 series · 8 of 24 positions shown · non-contrast
Comparison: Previous exam(s).

CLINICAL DATA: Screening.

EXAM:
DIGITAL SCREENING BILATERAL MAMMOGRAM WITH TOMO AND CAD

[L CC synth-2D]
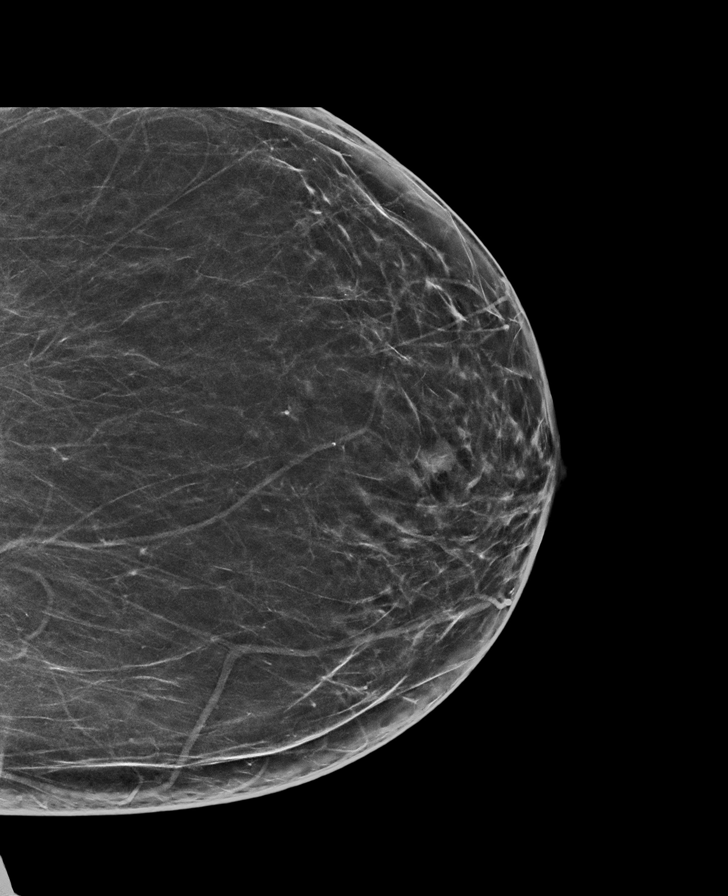

[R CC synth-2D]
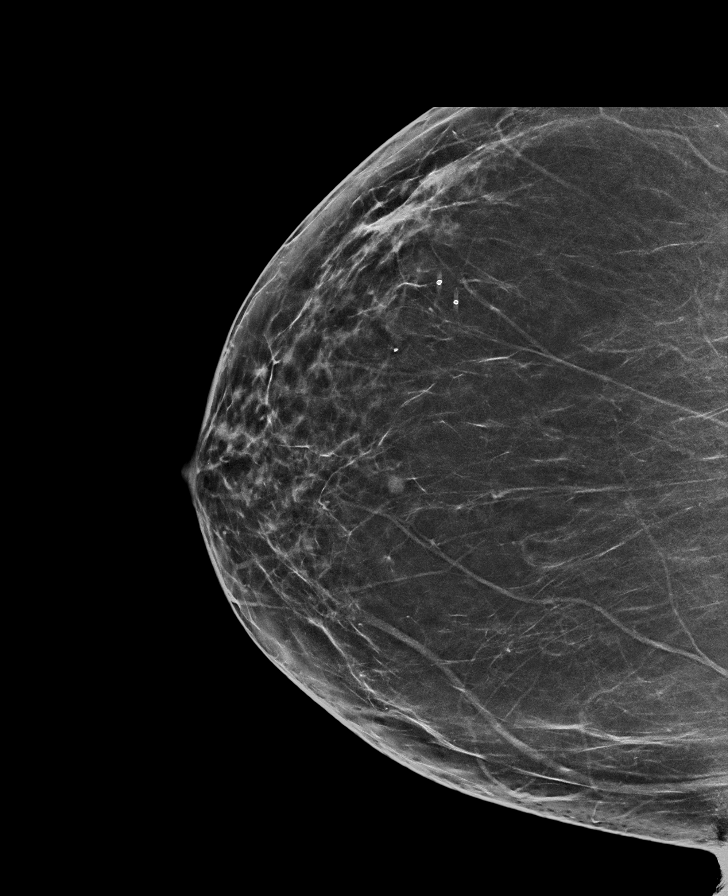

[L MLO synth-2D]
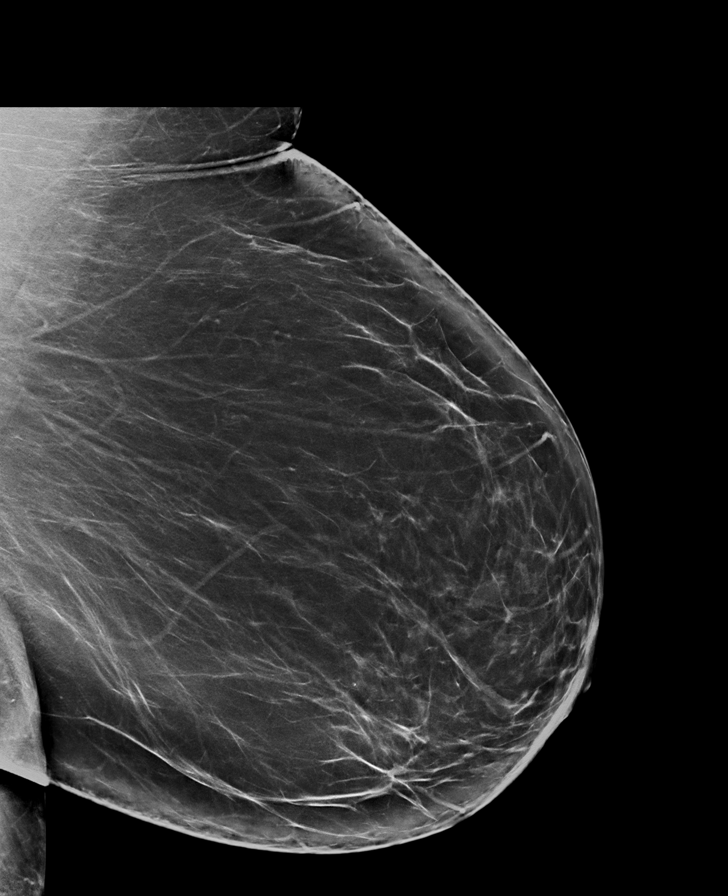

[R MLO synth-2D]
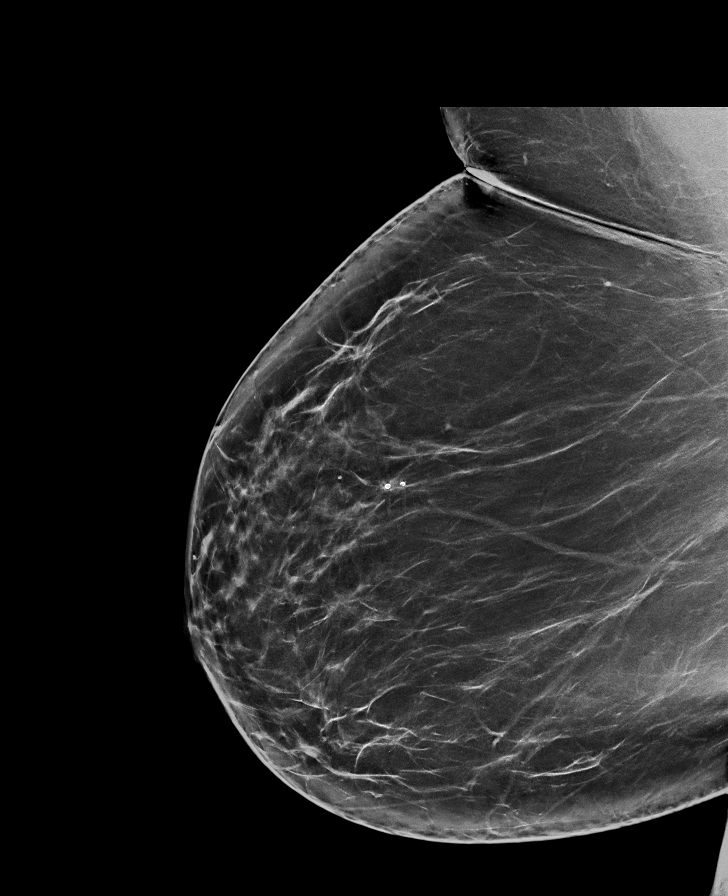

[R CC tomo · tomo slice 37/73.0]
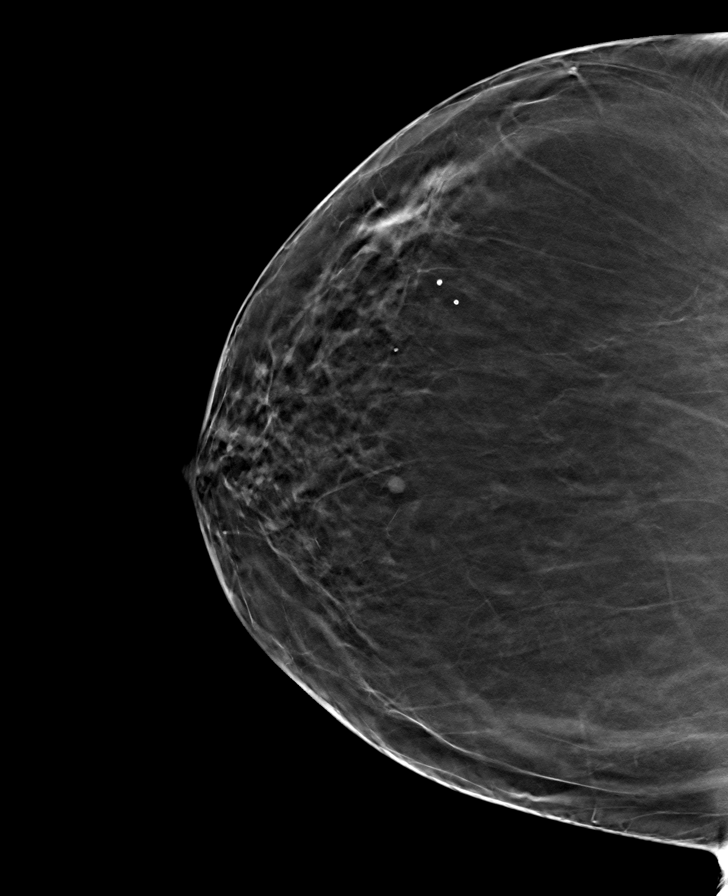

[R MLO tomo · tomo slice 46/91.0]
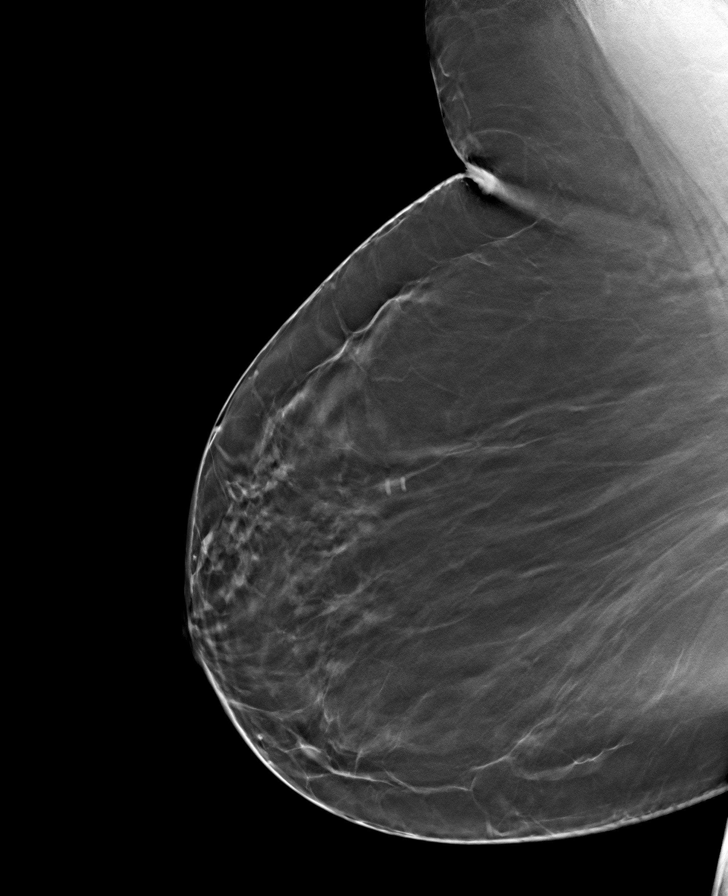

[L MLO tomo · tomo slice 47/92.0]
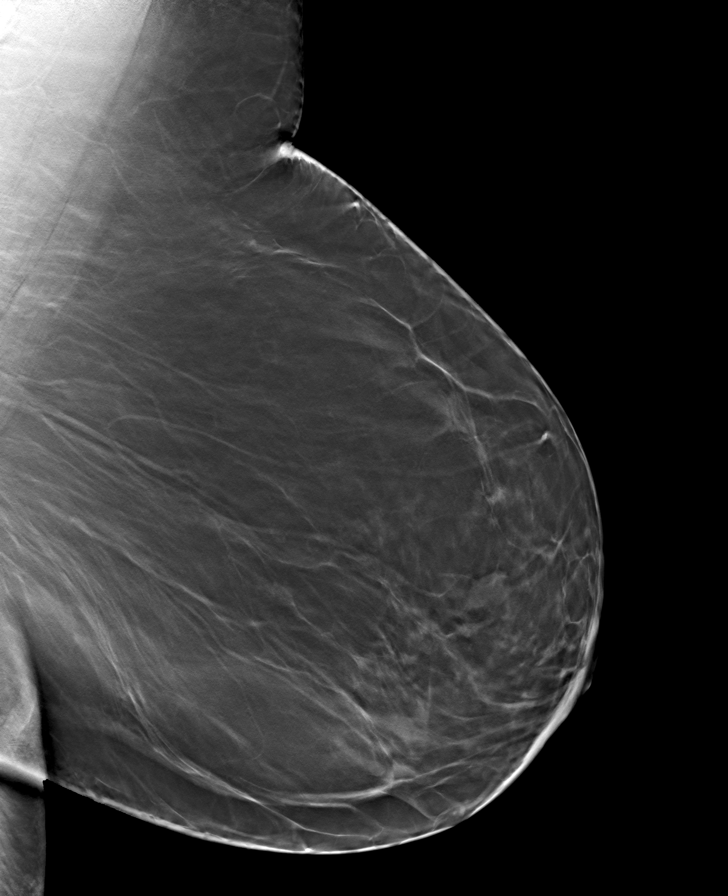

[L CC tomo · tomo slice 36/71.0]
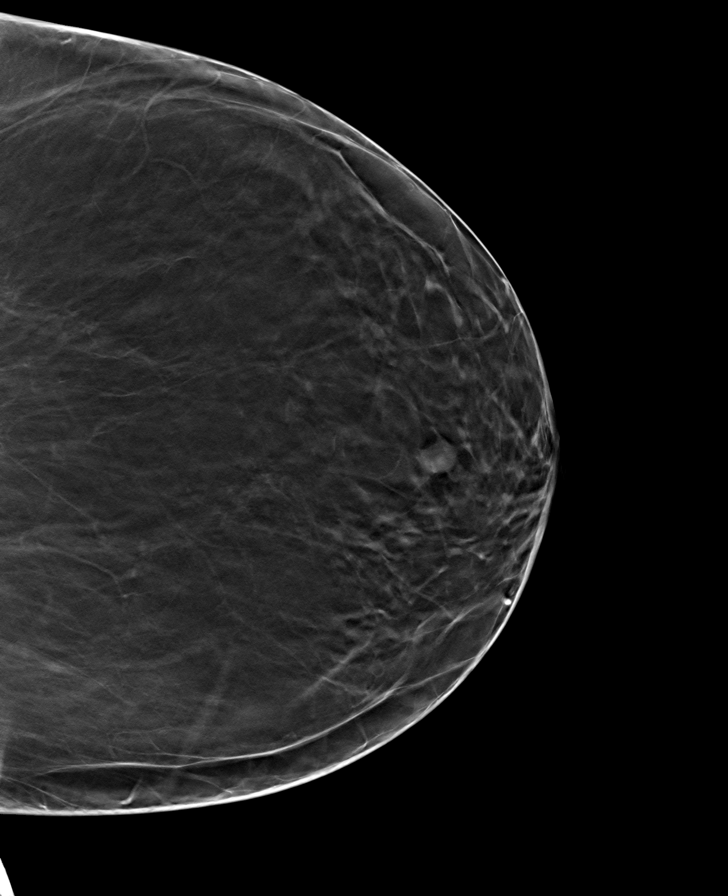

[8 of 24 positions shown; findings below may reference images not displayed]

ACR Breast Density Category b: There are scattered areas of
fibroglandular density.
FINDINGS: There are no findings suspicious for malignancy. Images were
processed with CAD.
IMPRESSION: No mammographic evidence of malignancy. A result letter of this
screening mammogram will be mailed directly to the patient.

RECOMMENDATION:
Screening mammogram in one year. (Code:[id number])

BI-RADS CATEGORY  1: Negative.

## 2021-01-09 ENCOUNTER — Other Ambulatory Visit: Payer: Self-pay

## 2021-01-09 ENCOUNTER — Encounter (INDEPENDENT_AMBULATORY_CARE_PROVIDER_SITE_OTHER): Payer: Medicare HMO | Admitting: Ophthalmology

## 2021-01-09 DIAGNOSIS — H59031 Cystoid macular edema following cataract surgery, right eye: Secondary | ICD-10-CM

## 2021-01-09 DIAGNOSIS — H338 Other retinal detachments: Secondary | ICD-10-CM

## 2021-01-09 DIAGNOSIS — H43812 Vitreous degeneration, left eye: Secondary | ICD-10-CM

## 2021-01-09 DIAGNOSIS — H35341 Macular cyst, hole, or pseudohole, right eye: Secondary | ICD-10-CM

## 2021-01-09 DIAGNOSIS — I10 Essential (primary) hypertension: Secondary | ICD-10-CM

## 2021-01-09 DIAGNOSIS — H35033 Hypertensive retinopathy, bilateral: Secondary | ICD-10-CM

## 2021-07-09 ENCOUNTER — Encounter (INDEPENDENT_AMBULATORY_CARE_PROVIDER_SITE_OTHER): Payer: Medicare HMO | Admitting: Ophthalmology

## 2021-07-09 ENCOUNTER — Other Ambulatory Visit: Payer: Self-pay

## 2021-07-09 DIAGNOSIS — H35033 Hypertensive retinopathy, bilateral: Secondary | ICD-10-CM | POA: Diagnosis not present

## 2021-07-09 DIAGNOSIS — H59031 Cystoid macular edema following cataract surgery, right eye: Secondary | ICD-10-CM

## 2021-07-09 DIAGNOSIS — H43812 Vitreous degeneration, left eye: Secondary | ICD-10-CM

## 2021-07-09 DIAGNOSIS — H35341 Macular cyst, hole, or pseudohole, right eye: Secondary | ICD-10-CM

## 2021-07-09 DIAGNOSIS — I10 Essential (primary) hypertension: Secondary | ICD-10-CM

## 2021-07-09 DIAGNOSIS — H33301 Unspecified retinal break, right eye: Secondary | ICD-10-CM

## 2021-07-09 DIAGNOSIS — H35371 Puckering of macula, right eye: Secondary | ICD-10-CM | POA: Diagnosis not present

## 2021-09-01 ENCOUNTER — Other Ambulatory Visit: Payer: Self-pay | Admitting: Physician Assistant

## 2021-09-01 ENCOUNTER — Other Ambulatory Visit: Payer: Self-pay | Admitting: Family Medicine

## 2021-09-02 ENCOUNTER — Other Ambulatory Visit: Payer: Self-pay | Admitting: Physician Assistant

## 2021-09-02 DIAGNOSIS — Z1231 Encounter for screening mammogram for malignant neoplasm of breast: Secondary | ICD-10-CM

## 2021-10-12 ENCOUNTER — Other Ambulatory Visit: Payer: Self-pay

## 2021-10-12 ENCOUNTER — Ambulatory Visit
Admission: RE | Admit: 2021-10-12 | Discharge: 2021-10-12 | Disposition: A | Discharge status: Home / Self Care | Payer: Medicare HMO | Source: Ambulatory Visit | Attending: Physician Assistant | Admitting: Physician Assistant

## 2021-10-12 DIAGNOSIS — Z1231 Encounter for screening mammogram for malignant neoplasm of breast: Secondary | ICD-10-CM | POA: Diagnosis present

## 2022-01-14 ENCOUNTER — Other Ambulatory Visit: Payer: Self-pay

## 2022-01-14 ENCOUNTER — Encounter (INDEPENDENT_AMBULATORY_CARE_PROVIDER_SITE_OTHER): Payer: Medicare Other | Admitting: Ophthalmology

## 2022-01-14 DIAGNOSIS — I10 Essential (primary) hypertension: Secondary | ICD-10-CM

## 2022-01-14 DIAGNOSIS — H43812 Vitreous degeneration, left eye: Secondary | ICD-10-CM

## 2022-01-14 DIAGNOSIS — H35033 Hypertensive retinopathy, bilateral: Secondary | ICD-10-CM | POA: Diagnosis not present

## 2022-01-14 DIAGNOSIS — H59031 Cystoid macular edema following cataract surgery, right eye: Secondary | ICD-10-CM

## 2022-01-14 DIAGNOSIS — H35341 Macular cyst, hole, or pseudohole, right eye: Secondary | ICD-10-CM

## 2022-01-14 DIAGNOSIS — H35372 Puckering of macula, left eye: Secondary | ICD-10-CM | POA: Diagnosis not present

## 2022-01-14 DIAGNOSIS — H33301 Unspecified retinal break, right eye: Secondary | ICD-10-CM

## 2022-07-22 ENCOUNTER — Encounter (INDEPENDENT_AMBULATORY_CARE_PROVIDER_SITE_OTHER): Payer: Medicare Other | Admitting: Ophthalmology

## 2022-07-22 DIAGNOSIS — H35371 Puckering of macula, right eye: Secondary | ICD-10-CM

## 2022-07-22 DIAGNOSIS — H33301 Unspecified retinal break, right eye: Secondary | ICD-10-CM

## 2022-07-22 DIAGNOSIS — H43812 Vitreous degeneration, left eye: Secondary | ICD-10-CM | POA: Diagnosis not present

## 2022-07-22 DIAGNOSIS — H35033 Hypertensive retinopathy, bilateral: Secondary | ICD-10-CM

## 2022-07-22 DIAGNOSIS — H59031 Cystoid macular edema following cataract surgery, right eye: Secondary | ICD-10-CM | POA: Diagnosis not present

## 2022-07-22 DIAGNOSIS — I10 Essential (primary) hypertension: Secondary | ICD-10-CM | POA: Diagnosis not present

## 2022-07-22 MED ORDER — PROLENSA 0.07 % OP SOLN
1.0000 [drp] | Freq: Once | OPHTHALMIC | 6 refills | Status: AC
Start: 2022-07-22 — End: 2022-07-22

## 2022-08-09 ENCOUNTER — Other Ambulatory Visit: Payer: Self-pay | Admitting: Physician Assistant

## 2022-08-09 DIAGNOSIS — Z1231 Encounter for screening mammogram for malignant neoplasm of breast: Secondary | ICD-10-CM

## 2022-10-13 ENCOUNTER — Ambulatory Visit
Admission: RE | Admit: 2022-10-13 | Discharge: 2022-10-13 | Disposition: A | Discharge status: Home / Self Care | Payer: Medicare Other | Source: Ambulatory Visit | Attending: Physician Assistant | Admitting: Physician Assistant

## 2022-10-13 DIAGNOSIS — Z1231 Encounter for screening mammogram for malignant neoplasm of breast: Secondary | ICD-10-CM | POA: Diagnosis present

## 2022-12-01 ENCOUNTER — Encounter: Payer: Self-pay | Admitting: Gastroenterology

## 2022-12-01 NOTE — H&P (Signed)
Pre-Procedure H&P   Patient ID: Marissa Craig is a 72 y.o. female.  Gastroenterology Provider: Annamaria Helling, DO  Referring Provider: Laurine Blazer, PA PCP: Marinda Elk, MD  Date: 12/02/2022  HPI Marissa Craig is a 71 y.o. female who presents today for Colonoscopy for Surveillance-personal history of colon polyps .  Patient with a personal history of colon polyps last underwent colonoscopy in November 2017 with 2 tubular adenomatous polyps.  Left-sided diverticulosis and internal hemorrhoids also noted.  Patient reports chronic constipation with a bowel movement every 2 to 3 days.  This is alleviated by use of Dulcolax.  She denies any melena or Marissa Craig.  She has rare blood on the outside of the stool associated with straining.  Sister has a history of colon polyps  July 2010 colonoscopy with polyps as well  A1c 5.5 creatinine 0.8 hemoglobin 13.4 MCV 97 platelets 259,000  Past Medical History:  Diagnosis Date   Hypertension    Obesity    Retinal detachment     Past Surgical History:  Procedure Laterality Date   CATARACT EXTRACTION     COLONOSCOPY WITH PROPOFOL N/A 10/04/2016   Procedure: COLONOSCOPY WITH PROPOFOL;  Surgeon: Manya Silvas, MD;  Location: [facility];  Service: Endoscopy;  Laterality: N/A;  Zosyn   EYE SURGERY     JOINT REPLACEMENT     rtk      Family History Sister- polyps No h/o GI disease or malignancy  Review of Systems  Constitutional:  Negative for activity change, appetite change, chills, diaphoresis, fatigue, fever and unexpected weight change.  HENT:  Negative for trouble swallowing and voice change.   Respiratory:  Negative for shortness of breath and wheezing.   Cardiovascular:  Negative for chest pain, palpitations and leg swelling.  Gastrointestinal:  Positive for constipation. Negative for abdominal distention, abdominal pain, anal bleeding, blood in stool, diarrhea, nausea, rectal pain and  vomiting.  Musculoskeletal:  Negative for arthralgias and myalgias.  Skin:  Negative for color change and pallor.  Neurological:  Negative for dizziness, syncope and weakness.  Psychiatric/Behavioral:  Negative for confusion.   All other systems reviewed and are negative.    Medications No current facility-administered medications on file prior to encounter.   Current Outpatient Medications on File Prior to Encounter  Medication Sig Dispense Refill   acetaminophen (TYLENOL) 500 MG tablet Take 500 mg by mouth every 6 (six) hours as needed.     amLODipine (NORVASC) 5 MG tablet Take 5 mg by mouth daily.     aspirin EC 81 MG tablet Take 81 mg by mouth daily.     valsartan-hydrochlorothiazide (DIOVAN-HCT) 160-25 MG tablet Take 1 tablet by mouth daily. (Patient not taking: Reported on 12/02/2022)     Zoster Vaccine Live (ZOSTAVAX [place]) Inject into the skin.      Pertinent medications related to GI and procedure were reviewed by me with the patient prior to the procedure   Current Facility-Administered Medications:    0.9 %  sodium chloride infusion, , Intravenous, Continuous, Annamaria Helling, DO      Allergies  Allergen Reactions   Soap Other (See Comments)    Skin burning   Allergies were reviewed by me prior to the procedure  Objective   Body mass index is 46.02 kg/m. Vitals:   12/02/22 0834  BP: (!) 142/85  Pulse: 65  Resp: 20  Temp: (!) 97.3 F (36.3 C)  TempSrc: Temporal  SpO2: 99%  Weight: 125.4  kg  Height: '5\' 5"'$  (1.651 m)     Physical Exam Vitals and nursing note reviewed.  Constitutional:      General: She is not in acute distress.    Appearance: Normal appearance. She is obese. She is not ill-appearing, toxic-appearing or diaphoretic.  HENT:     Head: Normocephalic and atraumatic.     Nose: Nose normal.     Mouth/Throat:     Mouth: Mucous membranes are moist.     Pharynx: Oropharynx is clear.  Eyes:     General: No scleral icterus.     Extraocular Movements: Extraocular movements intact.  Cardiovascular:     Rate and Rhythm: Normal rate and regular rhythm.     Heart sounds: Normal heart sounds. No murmur heard.    No friction rub. No gallop.  Pulmonary:     Effort: Pulmonary effort is normal. No respiratory distress.     Breath sounds: Normal breath sounds. No wheezing, rhonchi or rales.  Abdominal:     General: Abdomen is flat. Bowel sounds are normal. There is no distension.     Palpations: Abdomen is soft.     Tenderness: There is no abdominal tenderness. There is no guarding or rebound.  Musculoskeletal:     Cervical back: Neck supple.     Right lower leg: No edema.     Left lower leg: No edema.  Skin:    General: Skin is warm and dry.     Coloration: Skin is not jaundiced or pale.  Neurological:     General: No focal deficit present.     Mental Status: She is alert and oriented to person, place, and time. Mental status is at baseline.  Psychiatric:        Mood and Affect: Mood normal.        Behavior: Behavior normal.        Thought Content: Thought content normal.        Judgment: Judgment normal.      Assessment:  Marissa Craig is a 72 y.o. female  who presents today for Colonoscopy for Surveillance-personal history of colon polyps .  Plan:  Colonoscopy with possible intervention today  Colonoscopy with possible biopsy, control of bleeding, polypectomy, and interventions as necessary has been discussed with the patient/patient representative. Informed consent was obtained from the patient/patient representative after explaining the indication, nature, and risks of the procedure including but not limited to death, bleeding, perforation, missed neoplasm/lesions, cardiorespiratory compromise, and reaction to medications. Opportunity for questions was given and appropriate answers were provided. Patient/patient representative has verbalized understanding is amenable to undergoing the  procedure.   Annamaria Helling, DO  [facility] Gastroenterology  Portions of the record may have been created with voice recognition software. Occasional wrong-word or 'sound-a-like' substitutions may have occurred due to the inherent limitations of voice recognition software.  Read the chart carefully and recognize, using context, where substitutions may have occurred.

## 2022-12-02 ENCOUNTER — Encounter: Payer: Self-pay | Admitting: Gastroenterology

## 2022-12-02 ENCOUNTER — Ambulatory Visit: Payer: Medicare Other | Admitting: Registered Nurse

## 2022-12-02 ENCOUNTER — Encounter
Admission: RE | Disposition: A | Discharge status: Home / Self Care | Payer: Self-pay | Source: Home / Self Care | Attending: Gastroenterology

## 2022-12-02 ENCOUNTER — Ambulatory Visit
Admission: RE | Admit: 2022-12-02 | Discharge: 2022-12-02 | Disposition: A | Discharge status: Home / Self Care | Payer: Medicare Other | Attending: Gastroenterology | Admitting: Gastroenterology

## 2022-12-02 DIAGNOSIS — Z6841 Body Mass Index (BMI) 40.0 and over, adult: Secondary | ICD-10-CM | POA: Diagnosis not present

## 2022-12-02 DIAGNOSIS — Z87891 Personal history of nicotine dependence: Secondary | ICD-10-CM | POA: Insufficient documentation

## 2022-12-02 DIAGNOSIS — D123 Benign neoplasm of transverse colon: Secondary | ICD-10-CM | POA: Diagnosis not present

## 2022-12-02 DIAGNOSIS — D124 Benign neoplasm of descending colon: Secondary | ICD-10-CM | POA: Insufficient documentation

## 2022-12-02 DIAGNOSIS — Z86010 Personal history of colonic polyps: Secondary | ICD-10-CM | POA: Insufficient documentation

## 2022-12-02 DIAGNOSIS — K5909 Other constipation: Secondary | ICD-10-CM | POA: Insufficient documentation

## 2022-12-02 DIAGNOSIS — Z1211 Encounter for screening for malignant neoplasm of colon: Secondary | ICD-10-CM | POA: Diagnosis present

## 2022-12-02 DIAGNOSIS — D120 Benign neoplasm of cecum: Secondary | ICD-10-CM | POA: Insufficient documentation

## 2022-12-02 DIAGNOSIS — I10 Essential (primary) hypertension: Secondary | ICD-10-CM | POA: Diagnosis not present

## 2022-12-02 DIAGNOSIS — K5730 Diverticulosis of large intestine without perforation or abscess without bleeding: Secondary | ICD-10-CM | POA: Diagnosis not present

## 2022-12-02 DIAGNOSIS — D122 Benign neoplasm of ascending colon: Secondary | ICD-10-CM | POA: Diagnosis not present

## 2022-12-02 DIAGNOSIS — K640 First degree hemorrhoids: Secondary | ICD-10-CM | POA: Diagnosis not present

## 2022-12-02 HISTORY — PX: COLONOSCOPY: SHX5424

## 2022-12-02 SURGERY — COLONOSCOPY
Anesthesia: General

## 2022-12-02 MED ORDER — PROPOFOL 500 MG/50ML IV EMUL
INTRAVENOUS | Status: DC | PRN
  Administered 2022-12-02: 167.331 ug/kg/min via INTRAVENOUS

## 2022-12-02 MED ORDER — PROPOFOL 10 MG/ML IV BOLUS
INTRAVENOUS | Status: DC | PRN
  Administered 2022-12-02: 110 mg via INTRAVENOUS

## 2022-12-02 MED ORDER — LIDOCAINE HCL (CARDIAC) PF 100 MG/5ML IV SOSY
PREFILLED_SYRINGE | INTRAVENOUS | Status: DC | PRN
  Administered 2022-12-02: 100 mg via INTRAVENOUS

## 2022-12-02 MED ORDER — DEXMEDETOMIDINE HCL 200 MCG/2ML IV SOLN
INTRAVENOUS | Status: DC | PRN
  Administered 2022-12-02: 16 ug via INTRAVENOUS

## 2022-12-02 MED ORDER — EPHEDRINE SULFATE (PRESSORS) 50 MG/ML IJ SOLN
INTRAMUSCULAR | Status: DC | PRN
  Administered 2022-12-02 (×2): 10 mg via INTRAVENOUS

## 2022-12-02 MED ORDER — SODIUM CHLORIDE 0.9 % IV SOLN: INTRAVENOUS | Status: DC

## 2022-12-02 MED ORDER — GLYCOPYRROLATE 0.2 MG/ML IJ SOLN
INTRAMUSCULAR | Status: DC | PRN
  Administered 2022-12-02: .2 mg via INTRAVENOUS

## 2022-12-02 NOTE — Anesthesia Preprocedure Evaluation (Signed)
Anesthesia Evaluation  Patient identified by MRN, date of birth, ID band Patient awake    Reviewed: Allergy & Precautions, NPO status , Patient's Chart, lab work & pertinent test results  Airway Mallampati: III  TM Distance: >3 FB     Dental  (+) Teeth Intact   Pulmonary neg pulmonary ROS, Patient abstained from smoking., former smoker    + decreased breath sounds      Cardiovascular Exercise Tolerance: Good hypertension, Pt. on medications  Rhythm:Regular     Neuro/Psych negative neurological ROS  negative psych ROS   GI/Hepatic negative GI ROS, Neg liver ROS,,,  Endo/Other    Morbid obesity  Renal/GU negative Renal ROS  negative genitourinary   Musculoskeletal   Abdominal  (+) + obese  Peds negative pediatric ROS (+)  Hematology   Anesthesia Other Findings   Reproductive/Obstetrics                             Anesthesia Physical Anesthesia Plan  ASA: 3  Anesthesia Plan: General   Post-op Pain Management:    Induction: Intravenous  PONV Risk Score and Plan:   Airway Management Planned: Natural Airway  Additional Equipment:   Intra-op Plan:   Post-operative Plan:   Informed Consent: I have reviewed the patients History and Physical, chart, labs and discussed the procedure including the risks, benefits and alternatives for the proposed anesthesia with the patient or authorized representative who has indicated his/her understanding and acceptance.       Plan Discussed with: CRNA and Surgeon  Anesthesia Plan Comments:        Anesthesia Quick Evaluation

## 2022-12-02 NOTE — Transfer of Care (Signed)
Immediate Anesthesia Transfer of Care Note  Patient: Marissa Craig  Procedure(s) Performed: COLONOSCOPY  Patient Location: Endoscopy Unit  Anesthesia Type:General  Level of Consciousness: drowsy  Airway & Oxygen Therapy: Patient Spontanous Breathing  Post-op Assessment: Report given to RN and Post -op Vital signs reviewed and stable  Post vital signs: Reviewed and stable  Last Vitals:  Vitals Value Taken Time  BP 107/58 12/02/22 1005  Temp    Pulse 84 12/02/22 1005  Resp 18 12/02/22 1005  SpO2 94 % 12/02/22 1005  Vitals shown include unvalidated device data.  Last Pain:  Vitals:   12/02/22 0834  TempSrc: Temporal  PainSc: 0-No pain         Complications: No notable events documented.

## 2022-12-02 NOTE — Anesthesia Postprocedure Evaluation (Signed)
Anesthesia Post Note  Patient: Marissa Craig  Procedure(s) Performed: COLONOSCOPY  Patient location during evaluation: PACU Anesthesia Type: General Level of consciousness: awake and awake and alert Pain management: pain level controlled Vital Signs Assessment: post-procedure vital signs reviewed and stable Respiratory status: spontaneous breathing Cardiovascular status: stable Anesthetic complications: no  No notable events documented.   Last Vitals:  Vitals:   12/02/22 1014 12/02/22 1034  BP: 105/65   Pulse:    Resp:  16  Temp:    SpO2:      Last Pain:  Vitals:   12/02/22 1034  TempSrc:   PainSc: 0-No pain                 VAN STAVEREN,Benzion Mesta

## 2022-12-02 NOTE — Op Note (Signed)
[facility] Gastroenterology Patient Name: Marissa Craig Procedure Date: 12/02/2022 9:26 AM MRN: [id number] Account #: [id number] Date of Birth: June 14, 1951 Admit Type: Outpatient Age: 72 Room: [facility] ENDO ROOM 1 Gender: Female Note Status: Finalized Instrument Name: Colonoscope [id number] Procedure:             Colonoscopy Indications:           High risk colon cancer surveillance: Personal history                         of colonic polyps Providers:             Annamaria Helling DO, DO Referring MD:          Precious Bard, MD (Referring MD) Medicines:             Monitored Anesthesia Care Complications:         No immediate complications. Estimated blood loss:                         Minimal. Procedure:             Pre-Anesthesia Assessment:                        - Prior to the procedure, a History and Physical was                         performed, and patient medications and allergies were                         reviewed. The patient is competent. The risks and                         benefits of the procedure and the sedation options and                         risks were discussed with the patient. All questions                         were answered and informed consent was obtained.                         Patient identification and proposed procedure were                         verified by the physician, the nurse, the anesthetist                         and the technician in the endoscopy suite. Mental                         Status Examination: alert and oriented. Airway                         Examination: normal oropharyngeal airway and neck                         mobility. Respiratory Examination: clear to  auscultation. CV Examination: RRR, no murmurs, no S3                         or S4. Prophylactic Antibiotics: The patient does not                         require prophylactic antibiotics. Prior                          Anticoagulants: The patient has taken no anticoagulant                         or antiplatelet agents. ASA Grade Assessment: III - A                         patient with severe systemic disease. After reviewing                         the risks and benefits, the patient was deemed in                         satisfactory condition to undergo the procedure. The                         anesthesia plan was to use monitored anesthesia care                         (MAC). Immediately prior to administration of                         medications, the patient was re-assessed for adequacy                         to receive sedatives. The heart rate, respiratory                         rate, oxygen saturations, blood pressure, adequacy of                         pulmonary ventilation, and response to care were                         monitored throughout the procedure. The physical                         status of the patient was re-assessed after the                         procedure.                        After obtaining informed consent, the colonoscope was                         passed under direct vision. Throughout the procedure,                         the patient's blood pressure, pulse, and oxygen  saturations were monitored continuously. The                         Colonoscope was introduced through the anus and                         advanced to the the cecum, identified by appendiceal                         orifice and ileocecal valve. The colonoscopy was                         performed without difficulty. The patient tolerated                         the procedure well. The quality of the bowel                         preparation was evaluated using the BBPS [facility] Bowel                         Preparation Scale) with scores of: Right Colon = 3,                         Transverse Colon = 3 and Left Colon = 3 (entire mucosa                         seen  well with no residual staining, small fragments                         of stool or opaque liquid). The total BBPS score                         equals 9. The ileocecal valve, appendiceal orifice,                         and rectum were photographed. Findings:      The perianal and digital rectal examinations were normal. Pertinent       negatives include normal sphincter tone.      Multiple small-mouthed diverticula were found in the left colon.       Estimated blood loss: none.      Five sessile polyps were found in the descending colon (1), transverse       colon (1), ascending colon (2) and cecum (1). The polyps were 1 to 2 mm       in size. These polyps were removed with a jumbo cold forceps. Resection       and retrieval were complete. Estimated blood loss was minimal.      Retroflexion in the right colon was performed.      Non-bleeding internal hemorrhoids were found during retroflexion. The       hemorrhoids were Grade I (internal hemorrhoids that do not prolapse).       Estimated blood loss: none.      The exam was otherwise without abnormality on direct and retroflexion       views. Impression:            - Diverticulosis in the left colon.                        -  Five 1 to 2 mm polyps in the descending colon, in                         the transverse colon, in the ascending colon and in                         the cecum, removed with a jumbo cold forceps. Resected                         and retrieved.                        - Non-bleeding internal hemorrhoids.                        - The examination was otherwise normal on direct and                         retroflexion views. Recommendation:        - Patient has a contact number available for                         emergencies. The signs and symptoms of potential                         delayed complications were discussed with the patient.                         Return to normal activities tomorrow. Written                          discharge instructions were provided to the patient.                        - Discharge patient to home.                        - Resume previous diet.                        - Continue present medications.                        - No aspirin, ibuprofen, naproxen, or other                         non-steroidal anti-inflammatory drugs for 5 days after                         polyp removal.                        - Await pathology results.                        - Repeat colonoscopy for surveillance based on                         pathology results.                        -  Return to referring physician as previously                         scheduled.                        - The findings and recommendations were discussed with                         the patient. Procedure Code(s):     --- Professional ---                        [phone], Colonoscopy, flexible; with biopsy, single or                         multiple Diagnosis Code(s):     --- Professional ---                        Z86.010, Personal history of colonic polyps                        K64.0, First degree hemorrhoids                        D12.4, Benign neoplasm of descending colon                        D12.3, Benign neoplasm of transverse colon (hepatic                         flexure or splenic flexure)                        D12.2, Benign neoplasm of ascending colon                        D12.0, Benign neoplasm of cecum                        K57.30, Diverticulosis of large intestine without                         perforation or abscess without bleeding CPT copyright 2022 American [employer]. All rights reserved. The codes documented in this report are preliminary and upon coder review may  be revised to meet current compliance requirements. Attending Participation:      I personally performed the entire procedure. Volney American, DO Annamaria Helling DO, DO 12/02/2022 10:05:36 AM This report  has been signed electronically. Number of Addenda: 0 Note Initiated On: 12/02/2022 9:26 AM Scope Withdrawal Time: 0 hours 15 minutes 48 seconds  Total Procedure Duration: 0 hours 19 minutes 13 seconds  Estimated Blood Loss:  Estimated blood loss was minimal.      [facility]

## 2022-12-02 NOTE — Interval H&P Note (Signed)
History and Physical Interval Note: Preprocedure H&P from 12/02/22  was reviewed and there was no interval change after seeing and examining the patient.  Written consent was obtained from the patient after discussion of risks, benefits, and alternatives. Patient has consented to proceed with Colonoscopy with possible intervention   12/02/2022 9:28 AM  Marissa Craig  has presented today for surgery, with the diagnosis of Personal history of colonic polyps (Z86.010).  The various methods of treatment have been discussed with the patient and family. After consideration of risks, benefits and other options for treatment, the patient has consented to  Procedure(s): COLONOSCOPY (N/A) as a surgical intervention.  The patient's history has been reviewed, patient examined, no change in status, stable for surgery.  I have reviewed the patient's chart and labs.  Questions were answered to the patient's satisfaction.     Annamaria Helling

## 2022-12-03 ENCOUNTER — Encounter: Payer: Self-pay | Admitting: Gastroenterology

## 2022-12-06 LAB — SURGICAL PATHOLOGY

## 2023-01-21 ENCOUNTER — Encounter (INDEPENDENT_AMBULATORY_CARE_PROVIDER_SITE_OTHER): Payer: Medicare Other | Admitting: Ophthalmology

## 2023-01-21 DIAGNOSIS — H59031 Cystoid macular edema following cataract surgery, right eye: Secondary | ICD-10-CM | POA: Diagnosis not present

## 2023-01-21 DIAGNOSIS — H43812 Vitreous degeneration, left eye: Secondary | ICD-10-CM

## 2023-01-21 DIAGNOSIS — I10 Essential (primary) hypertension: Secondary | ICD-10-CM | POA: Diagnosis not present

## 2023-01-21 DIAGNOSIS — H2512 Age-related nuclear cataract, left eye: Secondary | ICD-10-CM

## 2023-01-21 DIAGNOSIS — H35341 Macular cyst, hole, or pseudohole, right eye: Secondary | ICD-10-CM | POA: Diagnosis not present

## 2023-01-21 DIAGNOSIS — H35033 Hypertensive retinopathy, bilateral: Secondary | ICD-10-CM | POA: Diagnosis not present

## 2023-01-21 DIAGNOSIS — H35371 Puckering of macula, right eye: Secondary | ICD-10-CM

## 2023-03-22 ENCOUNTER — Encounter: Payer: Self-pay | Admitting: Physician Assistant

## 2023-03-22 ENCOUNTER — Other Ambulatory Visit: Payer: Self-pay | Admitting: Physician Assistant

## 2023-03-22 ENCOUNTER — Ambulatory Visit
Admission: RE | Admit: 2023-03-22 | Discharge: 2023-03-22 | Disposition: A | Discharge status: Home / Self Care | Payer: Medicare Other | Source: Ambulatory Visit | Attending: Physician Assistant | Admitting: Physician Assistant

## 2023-03-22 DIAGNOSIS — M7989 Other specified soft tissue disorders: Secondary | ICD-10-CM | POA: Insufficient documentation

## 2023-08-31 ENCOUNTER — Other Ambulatory Visit: Payer: Self-pay | Admitting: Physician Assistant

## 2023-08-31 DIAGNOSIS — Z1231 Encounter for screening mammogram for malignant neoplasm of breast: Secondary | ICD-10-CM

## 2023-10-17 ENCOUNTER — Ambulatory Visit
Admission: RE | Admit: 2023-10-17 | Discharge: 2023-10-17 | Disposition: A | Discharge status: Home / Self Care | Payer: Medicare Other | Source: Ambulatory Visit | Attending: Physician Assistant | Admitting: Physician Assistant

## 2023-10-17 DIAGNOSIS — Z1231 Encounter for screening mammogram for malignant neoplasm of breast: Secondary | ICD-10-CM | POA: Insufficient documentation

## 2024-06-16 ENCOUNTER — Ambulatory Visit
Admission: EM | Admit: 2024-06-16 | Discharge: 2024-06-16 | Disposition: A | Discharge status: Home / Self Care | Attending: Family Medicine | Admitting: Family Medicine

## 2024-06-16 DIAGNOSIS — U071 COVID-19: Secondary | ICD-10-CM | POA: Diagnosis present

## 2024-06-16 LAB — RESP PANEL BY RT-PCR (FLU A&B, COVID) ARPGX2
Influenza A by PCR: NEGATIVE
Influenza B by PCR: NEGATIVE
SARS Coronavirus 2 by RT PCR: POSITIVE — AB

## 2024-06-16 MED ORDER — PROMETHAZINE-DM 6.25-15 MG/5ML PO SYRP: 5.0000 mL | ORAL_SOLUTION | Freq: Four times a day (QID) | ORAL | 0 refills | Status: AC | PRN

## 2024-06-16 NOTE — ED Triage Notes (Signed)
 Pt c/o fatigue, loss of appetite, cough, sneezing,   Pt states that she has felt dizzy when moving  Pt was around a choir member who had covid  Pt asks for a covid test

## 2024-06-16 NOTE — ED Provider Notes (Signed)
 [facility]    CSN: [id number] Arrival date & time: 06/16/24  1500      History   Chief Complaint Chief Complaint  Patient presents with   Cough    HPI  73 year old female presents with fatigue, loss of appetite, cough, and sneezing.  Started earlier in the week.  She has had symptoms for the past 4 to 5 days.  Patient was around a choir member who tested positive for COVID.  She is concerned that she has COVID-19.  No documented fever.  No relieving factors.  She is most bothered by the cough.  Past Medical History:  Diagnosis Date   Hypertension    Obesity    Retinal detachment     There are no active problems to display for this patient.   Past Surgical History:  Procedure Laterality Date   CATARACT EXTRACTION     COLONOSCOPY N/A 12/02/2022   Procedure: COLONOSCOPY;  Surgeon: Onita Elspeth Sharper, DO;  Location: [facility] ENDOSCOPY;  Service: Gastroenterology;  Laterality: N/A;   COLONOSCOPY WITH PROPOFOL  N/A 10/04/2016   Procedure: COLONOSCOPY WITH PROPOFOL ;  Surgeon: Lamar ONEIDA Holmes, MD;  Location: [facility] ENDOSCOPY;  Service: Endoscopy;  Laterality: N/A;  Zosyn    EYE SURGERY     JOINT REPLACEMENT     rtk      OB History   No obstetric history on file.      Home Medications    Prior to Admission medications   Medication Sig Start Date End Date Taking? Authorizing Provider  acetaminophen  (TYLENOL ) 500 MG tablet Take 500 mg by mouth every 6 (six) hours as needed.   Yes [provider]  amLODipine (NORVASC) 5 MG tablet Take 5 mg by mouth daily.   Yes [provider]  aspirin EC 81 MG tablet Take 81 mg by mouth daily.   Yes [provider]  fluticasone (FLONASE) 50 MCG/ACT nasal spray Place 2 sprays into both nostrils daily.   Yes [provider]  hydrochlorothiazide (HYDRODIURIL) 25 MG tablet Take 25 mg by mouth daily.   Yes [provider]  latanoprost (XALATAN) 0.005 % ophthalmic solution SMARTSIG:In Eye(s)    Yes [provider]  losartan (COZAAR) 100 MG tablet Take 100 mg by mouth. 02/02/22  Yes [provider]  Multiple Vitamin (MULTI-VITAMIN) tablet Take 1 tablet by mouth daily.   Yes [provider]  potassium chloride (KLOR-CON M) 10 MEQ tablet Take 10 mEq by mouth daily.   Yes [provider]  promethazine -dextromethorphan (PROMETHAZINE -DM) 6.25-15 MG/5ML syrup Take 5 mLs by mouth 4 (four) times daily as needed for cough. 06/16/24  Yes Quaron Delacruz G, DO  rosuvastatin (CRESTOR) 10 MG tablet Take 10 mg by mouth at bedtime.   Yes [provider]  valsartan-hydrochlorothiazide (DIOVAN-HCT) 160-25 MG tablet Take 1 tablet by mouth daily.   Yes [provider]  Zoster Vaccine Live (ZOSTAVAX [place]) Inject into the skin.    [provider]    Family History Family History  Problem Relation Age of Onset   Breast cancer Neg Hx     Social History Social History   Tobacco Use   Smoking status: Former   Smokeless tobacco: Never  Vaping Use   Vaping status: Never Used  Substance Use Topics   Alcohol use: No   Drug use: No     Allergies   Soap   Review of Systems Review of Systems  Respiratory:  Positive for cough.      Physical  Exam Triage Vital Signs ED Triage Vitals  Encounter Vitals Group     BP 06/16/24 1511 (!) 134/90     Girls Systolic BP Percentile --      Girls Diastolic BP Percentile --      Boys Systolic BP Percentile --      Boys Diastolic BP Percentile --      Pulse Rate 06/16/24 1511 62     Resp --      Temp 06/16/24 1511 98.6 F (37 C)     Temp Source 06/16/24 1511 Oral     SpO2 06/16/24 1511 93 %     Weight 06/16/24 1510 270 lb (122.5 kg)     Height 06/16/24 1510 5' 5 (1.651 m)     Head Circumference --      Peak Flow --      Pain Score 06/16/24 1510 0     Pain Loc --      Pain Education --      Exclude from Growth Chart --    No data found.  Updated Vital Signs BP (!) 134/90 (BP Location:  Left Arm)   Pulse 62   Temp 98.6 F (37 C) (Oral)   Ht 5' 5 (1.651 m)   Wt 122.5 kg   SpO2 93%   BMI 44.93 kg/m   Visual Acuity Right Eye Distance:   Left Eye Distance:   Bilateral Distance:    Right Eye Near:   Left Eye Near:    Bilateral Near:     Physical Exam Vitals and nursing note reviewed.  Constitutional:      General: She is not in acute distress.    Appearance: Normal appearance. She is obese.  HENT:     Head: Normocephalic and atraumatic.  Eyes:     General:        Right eye: No discharge.        Left eye: No discharge.     Conjunctiva/sclera: Conjunctivae normal.  Cardiovascular:     Rate and Rhythm: Normal rate and regular rhythm.  Pulmonary:     Effort: Pulmonary effort is normal.     Breath sounds: Normal breath sounds. No wheezing or rales.  Neurological:     Mental Status: She is alert.      UC Treatments / Results  Labs (all labs ordered are listed, but only abnormal results are displayed) Labs Reviewed  RESP PANEL BY RT-PCR (FLU A&B, COVID) ARPGX2 - Abnormal; Notable for the following components:      Result Value   SARS Coronavirus 2 by RT PCR POSITIVE (*)    All other components within normal limits    EKG   Radiology No results found.  Procedures Procedures (including critical care time)  Medications Ordered in UC Medications - No data to display  Initial Impression / Assessment and Plan / UC Course  I have reviewed the triage vital signs and the nursing notes.  Pertinent labs & imaging results that were available during my care of the patient were reviewed by me and considered in my medical decision making (see chart for details).    73 year old female presents with respiratory symptoms.  COVID-positive.  Offered antiviral treatment.  Patient states that since she is on the tail end of illness she does not want to proceed with this.  Promethazine  DM for cough.  Final Clinical Impressions(s) / UC Diagnoses   Final  diagnoses:  COVID     Discharge Instructions  Rest. Fluids.  Medication as directed.      ED Prescriptions     Medication Sig Dispense Auth. Provider   promethazine -dextromethorphan (PROMETHAZINE -DM) 6.25-15 MG/5ML syrup Take 5 mLs by mouth 4 (four) times daily as needed for cough. 118 mL Trei Schoch G, DO      PDMP not reviewed this encounter.   Shaine Newmark G, [place] 06/16/24 1625

## 2024-06-16 NOTE — Discharge Instructions (Signed)
 Rest. Fluids.  Medication as directed.

## 2024-08-06 ENCOUNTER — Encounter (INDEPENDENT_AMBULATORY_CARE_PROVIDER_SITE_OTHER): Admitting: Ophthalmology

## 2024-08-06 DIAGNOSIS — I10 Essential (primary) hypertension: Secondary | ICD-10-CM | POA: Diagnosis not present

## 2024-08-06 DIAGNOSIS — H35341 Macular cyst, hole, or pseudohole, right eye: Secondary | ICD-10-CM | POA: Diagnosis not present

## 2024-08-06 DIAGNOSIS — H59031 Cystoid macular edema following cataract surgery, right eye: Secondary | ICD-10-CM

## 2024-08-06 DIAGNOSIS — H43812 Vitreous degeneration, left eye: Secondary | ICD-10-CM | POA: Diagnosis not present

## 2024-08-06 DIAGNOSIS — H2512 Age-related nuclear cataract, left eye: Secondary | ICD-10-CM

## 2024-08-06 DIAGNOSIS — H35033 Hypertensive retinopathy, bilateral: Secondary | ICD-10-CM

## 2024-08-06 DIAGNOSIS — H33301 Unspecified retinal break, right eye: Secondary | ICD-10-CM

## 2024-09-04 ENCOUNTER — Encounter (INDEPENDENT_AMBULATORY_CARE_PROVIDER_SITE_OTHER): Admitting: Ophthalmology

## 2024-09-04 DIAGNOSIS — H59031 Cystoid macular edema following cataract surgery, right eye: Secondary | ICD-10-CM | POA: Diagnosis not present

## 2024-09-04 DIAGNOSIS — I10 Essential (primary) hypertension: Secondary | ICD-10-CM | POA: Diagnosis not present

## 2024-09-04 DIAGNOSIS — H35033 Hypertensive retinopathy, bilateral: Secondary | ICD-10-CM | POA: Diagnosis not present

## 2024-09-04 DIAGNOSIS — H43812 Vitreous degeneration, left eye: Secondary | ICD-10-CM | POA: Diagnosis not present

## 2024-09-11 ENCOUNTER — Other Ambulatory Visit: Payer: Self-pay | Admitting: Physician Assistant

## 2024-09-11 DIAGNOSIS — Z1231 Encounter for screening mammogram for malignant neoplasm of breast: Secondary | ICD-10-CM

## 2024-10-17 ENCOUNTER — Ambulatory Visit
Admission: RE | Admit: 2024-10-17 | Discharge: 2024-10-17 | Disposition: A | Source: Ambulatory Visit | Attending: Physician Assistant | Admitting: Physician Assistant

## 2024-10-17 DIAGNOSIS — Z1231 Encounter for screening mammogram for malignant neoplasm of breast: Secondary | ICD-10-CM | POA: Insufficient documentation

## 2024-12-04 ENCOUNTER — Encounter (INDEPENDENT_AMBULATORY_CARE_PROVIDER_SITE_OTHER): Admitting: Ophthalmology

## 2024-12-04 DIAGNOSIS — I10 Essential (primary) hypertension: Secondary | ICD-10-CM

## 2024-12-04 DIAGNOSIS — H35033 Hypertensive retinopathy, bilateral: Secondary | ICD-10-CM | POA: Diagnosis not present

## 2024-12-04 DIAGNOSIS — H43812 Vitreous degeneration, left eye: Secondary | ICD-10-CM | POA: Diagnosis not present

## 2024-12-04 DIAGNOSIS — H35341 Macular cyst, hole, or pseudohole, right eye: Secondary | ICD-10-CM | POA: Diagnosis not present

## 2024-12-04 DIAGNOSIS — H35371 Puckering of macula, right eye: Secondary | ICD-10-CM

## 2024-12-04 DIAGNOSIS — H59031 Cystoid macular edema following cataract surgery, right eye: Secondary | ICD-10-CM

## 2025-04-03 ENCOUNTER — Encounter (INDEPENDENT_AMBULATORY_CARE_PROVIDER_SITE_OTHER): Admitting: Ophthalmology
